# Patient Record
Sex: Male | Born: 1950 | Race: Black or African American | Hispanic: No | Marital: Married | Smoking: Never smoker
Health system: Southern US, Community
[De-identification: ages and names within clinical notes are randomized; demographics above are authoritative.]

## PROBLEM LIST (undated history)

## (undated) DIAGNOSIS — I10 Essential (primary) hypertension: Secondary | ICD-10-CM

## (undated) HISTORY — DX: Essential (primary) hypertension: I10

---

## 1999-11-23 ENCOUNTER — Encounter: Payer: Self-pay | Admitting: Emergency Medicine

## 1999-11-23 ENCOUNTER — Emergency Department (HOSPITAL_COMMUNITY): Admission: EM | Admit: 1999-11-23 | Discharge: 1999-11-23 | Payer: Self-pay | Admitting: Emergency Medicine

## 2001-12-28 ENCOUNTER — Emergency Department (HOSPITAL_COMMUNITY): Admission: EM | Admit: 2001-12-28 | Discharge: 2001-12-28 | Payer: Self-pay | Admitting: *Deleted

## 2001-12-28 ENCOUNTER — Encounter: Payer: Self-pay | Admitting: Emergency Medicine

## 2002-02-20 ENCOUNTER — Emergency Department (HOSPITAL_COMMUNITY): Admission: EM | Admit: 2002-02-20 | Discharge: 2002-02-20 | Payer: Self-pay | Admitting: Emergency Medicine

## 2012-01-24 ENCOUNTER — Ambulatory Visit (INDEPENDENT_AMBULATORY_CARE_PROVIDER_SITE_OTHER): Payer: BC Managed Care – PPO | Admitting: Internal Medicine

## 2012-01-24 VITALS — BP 144/78 | HR 76 | Temp 97.8°F | Resp 16 | Ht 67.5 in | Wt 149.0 lb

## 2012-01-24 DIAGNOSIS — Z719 Counseling, unspecified: Secondary | ICD-10-CM

## 2012-01-24 DIAGNOSIS — Z111 Encounter for screening for respiratory tuberculosis: Secondary | ICD-10-CM

## 2012-01-24 NOTE — Patient Instructions (Signed)

## 2012-01-24 NOTE — Progress Notes (Signed)
  Subjective:    Patient ID: Richard Lucas, male    DOB: November 29, 1950, 61 y.o.   MRN: [id number]  HPI Language barrier. 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 He appears well-developed and well-nourished.  HENT:  Right Ear: External ear normal.  Left Ear: External ear normal.  Eyes: EOM are normal.  Neck: Normal range of motion. Neck supple.  Cardiovascular: Normal rate, regular rhythm and normal heart sounds.   Pulmonary/Chest: Effort normal and breath sounds normal.  Neurological: He is alert and oriented to person, place, and time.  Skin: Skin is warm and dry.  Psychiatric: He has a normal mood and affect.          Assessment & Plan:  Apply PPD, read 48-72 hrs

## 2012-01-26 ENCOUNTER — Ambulatory Visit (INDEPENDENT_AMBULATORY_CARE_PROVIDER_SITE_OTHER): Payer: BC Managed Care – PPO | Admitting: Physician Assistant

## 2012-01-26 DIAGNOSIS — Z111 Encounter for screening for respiratory tuberculosis: Secondary | ICD-10-CM

## 2012-01-26 LAB — TB SKIN TEST
Induration: 0
TB Skin Test: NEGATIVE mm

## 2012-01-29 ENCOUNTER — Ambulatory Visit (INDEPENDENT_AMBULATORY_CARE_PROVIDER_SITE_OTHER): Payer: BC Managed Care – PPO | Admitting: Physician Assistant

## 2012-01-29 VITALS — BP 136/72 | HR 66 | Temp 97.5°F | Resp 16 | Ht 67.5 in | Wt 148.2 lb

## 2012-01-29 DIAGNOSIS — Z111 Encounter for screening for respiratory tuberculosis: Secondary | ICD-10-CM | POA: Insufficient documentation

## 2012-01-29 NOTE — Progress Notes (Signed)
   Patient ID: Jonathandavid Marlett MRN: [id number], DOB: June 20, 1951, 61 y.o. Date of Encounter: 01/29/2012, 3:04 PM  Primary Physician: No primary provider on file.  Chief Complaint: PPD placement  HPI: 61 y.o. year old male here for PPD. No prior positive PPD. Asymptomatic. Needs for school. This is his second PPD in a two-step PPD. No forms to fill out today.    Past Medical History  Diagnosis Date  . Hypertension      Home Meds: Prior to Admission medications   Medication Sig Start Date End Date Taking? Authorizing Provider  LISINOPRIL PO Take by mouth.   Yes Historical Provider, MD  lisinopril-hydrochlorothiazide (PRINZIDE,ZESTORETIC) 20-12.5 MG per tablet Take 1 tablet by mouth daily.   Yes Historical Provider, MD    Allergies: No Known Allergies  History   Social History  . Marital Status: Married    Spouse Name: N/A    Number of Children: N/A  . Years of Education: N/A   Occupational History  . Not on file.   Social History Main Topics  . Smoking status: Never Smoker   . Smokeless tobacco: Not on file  . Alcohol Use: Not on file  . Drug Use: Not on file  . Sexually Active: Not on file   Other Topics Concern  . Not on file   Social History Narrative  . No narrative on file     Review of Systems: Constitutional: negative for chills, fever, night sweats, weight changes, or fatigue  HEENT: negative for vision changes, hearing loss, or epistaxis Cardiovascular: negative for chest pain or palpitations Respiratory: negative for hemoptysis, wheezing, shortness of breath, or cough Abdominal: negative for abdominal pain, nausea, vomiting, diarrhea, or constipation Dermatological: negative for rash Neurologic: negative for headache, dizziness, or syncope All other systems reviewed and are otherwise negative with the exception to those above and in the HPI.   Physical Exam: Blood pressure 136/72, pulse 66, temperature 97.5 F (36.4 C), temperature source Oral, resp.  rate 16, height 5' 7.5" (1.715 m), weight 148 lb 3.2 oz (67.223 kg)., Body mass index is 22.87 kg/(m^2). General: Well developed, well nourished, in no acute distress. Head: Normocephalic, atraumatic, eyes without discharge, sclera non-icteric, nares are without discharge.   Neck: Supple. No thyromegaly. Full ROM. No lymphadenopathy. Lungs: Clear bilaterally to auscultation without wheezes, rales, or rhonchi. Breathing is unlabored. Heart: RRR with S1 S2. No murmurs, rubs, or gallops appreciated. Msk:  Strength and tone normal for age. Extremities/Skin: Warm and dry. No clubbing or cyanosis. No edema. No rashes or suspicious lesions. Neuro: Alert and oriented X 3. Moves all extremities spontaneously. Gait is normal. CNII-XII grossly in tact. Psych:  Responds to questions appropriately with a normal affect.     ASSESSMENT AND PLAN:  61 y.o. year old male here for PPD. -PPD placed -RTC 48-72 hours for reading  Signed, Eula Listen, PA-C 01/29/2012 3:04 PM

## 2012-02-01 ENCOUNTER — Ambulatory Visit (INDEPENDENT_AMBULATORY_CARE_PROVIDER_SITE_OTHER): Payer: BC Managed Care – PPO | Admitting: Physician Assistant

## 2012-02-01 DIAGNOSIS — Z111 Encounter for screening for respiratory tuberculosis: Secondary | ICD-10-CM

## 2012-02-01 LAB — TB SKIN TEST
Induration: 0 mm
TB Skin Test: NEGATIVE

## 2012-06-03 ENCOUNTER — Encounter: Payer: Self-pay | Admitting: Family Medicine

## 2012-06-03 DIAGNOSIS — Z789 Other specified health status: Secondary | ICD-10-CM | POA: Insufficient documentation

## 2012-10-22 ENCOUNTER — Emergency Department (HOSPITAL_COMMUNITY)
Admission: EM | Admit: 2012-10-22 | Discharge: 2012-10-22 | Disposition: A | Discharge status: Home / Self Care | Payer: Managed Care, Other (non HMO) | Attending: Emergency Medicine | Admitting: Emergency Medicine

## 2012-10-22 ENCOUNTER — Encounter (HOSPITAL_COMMUNITY): Payer: Self-pay | Admitting: *Deleted

## 2012-10-22 DIAGNOSIS — E860 Dehydration: Secondary | ICD-10-CM

## 2012-10-22 DIAGNOSIS — R5381 Other malaise: Secondary | ICD-10-CM | POA: Insufficient documentation

## 2012-10-22 DIAGNOSIS — I10 Essential (primary) hypertension: Secondary | ICD-10-CM | POA: Insufficient documentation

## 2012-10-22 DIAGNOSIS — R066 Hiccough: Secondary | ICD-10-CM | POA: Insufficient documentation

## 2012-10-22 DIAGNOSIS — R6883 Chills (without fever): Secondary | ICD-10-CM | POA: Insufficient documentation

## 2012-10-22 DIAGNOSIS — K529 Noninfective gastroenteritis and colitis, unspecified: Secondary | ICD-10-CM

## 2012-10-22 DIAGNOSIS — K5289 Other specified noninfective gastroenteritis and colitis: Secondary | ICD-10-CM | POA: Insufficient documentation

## 2012-10-22 DIAGNOSIS — E876 Hypokalemia: Secondary | ICD-10-CM

## 2012-10-22 DIAGNOSIS — R42 Dizziness and giddiness: Secondary | ICD-10-CM | POA: Insufficient documentation

## 2012-10-22 DIAGNOSIS — Z79899 Other long term (current) drug therapy: Secondary | ICD-10-CM | POA: Insufficient documentation

## 2012-10-22 LAB — COMPREHENSIVE METABOLIC PANEL
AST: 27 U/L (ref 0–37)
BUN: 18 mg/dL (ref 6–23)
CO2: 32 mEq/L (ref 19–32)
Calcium: 9.1 mg/dL (ref 8.4–10.5)
Chloride: 97 mEq/L (ref 96–112)
Creatinine, Ser: 1.25 mg/dL (ref 0.50–1.35)
GFR calc Af Amer: 69 mL/min — ABNORMAL LOW (ref >90)
GFR calc non Af Amer: 60 mL/min — ABNORMAL LOW (ref >90)
Total Bilirubin: 1.6 mg/dL — ABNORMAL HIGH (ref 0.3–1.2)

## 2012-10-22 LAB — GLUCOSE, CAPILLARY: Glucose-Capillary: 126 mg/dL — ABNORMAL HIGH (ref 70–99)

## 2012-10-22 LAB — CBC
HCT: 46.1 % (ref 39.0–52.0)
MCH: 29.5 pg (ref 26.0–34.0)
MCV: 86.7 fL (ref 78.0–100.0)
Platelets: 162 10*3/uL (ref 150–400)
RBC: 5.32 MIL/uL (ref 4.22–5.81)

## 2012-10-22 MED ORDER — METOCLOPRAMIDE HCL 5 MG/ML IJ SOLN
10.0000 mg | Freq: Once | INTRAMUSCULAR | Status: AC
  Administered 2012-10-22: 10 mg via INTRAVENOUS
  Filled 2012-10-22: qty 2

## 2012-10-22 MED ORDER — SODIUM CHLORIDE 0.9 % IV BOLUS (SEPSIS)
1000.0000 mL | Freq: Once | INTRAVENOUS | Status: AC
  Administered 2012-10-22: 1000 mL via INTRAVENOUS

## 2012-10-22 MED ORDER — POTASSIUM CHLORIDE CRYS ER 20 MEQ PO TBCR
40.0000 meq | EXTENDED_RELEASE_TABLET | Freq: Once | ORAL | Status: AC
  Administered 2012-10-22: 40 meq via ORAL
  Filled 2012-10-22: qty 2

## 2012-10-22 MED ORDER — ONDANSETRON HCL 4 MG PO TABS: 4.0000 mg | ORAL_TABLET | Freq: Three times a day (TID) | ORAL | Status: DC | PRN

## 2012-10-22 MED ORDER — ONDANSETRON HCL 4 MG/2ML IJ SOLN
4.0000 mg | Freq: Once | INTRAMUSCULAR | Status: AC
  Administered 2012-10-22: 4 mg via INTRAVENOUS
  Filled 2012-10-22: qty 2

## 2012-10-22 MED ORDER — GI COCKTAIL ~~LOC~~
30.0000 mL | Freq: Once | ORAL | Status: AC
  Administered 2012-10-22: 30 mL via ORAL
  Filled 2012-10-22: qty 30

## 2012-10-22 NOTE — ED Notes (Signed)
Pt arrives from home by [facility] with c/o nausea x's 2 days and vomiting starting today.

## 2012-10-22 NOTE — ED Notes (Signed)
Pt c/o indigestion and requesting something for it. Dr. Truitt Merle and orders placed. Medication to be given after nausea is resolved.

## 2012-10-22 NOTE — ED Notes (Signed)
Pt tolerated 1 cu[p of ginger ale without nausea or vomiting. Pt ambulated greater than 34ft with difficulty.

## 2012-10-22 NOTE — ED Notes (Signed)
[id number]> Expected date:<BR> Expected time:<BR> Means of arrival:<BR> Comments:<BR> Ems/ n/v,

## 2012-10-22 NOTE — ED Provider Notes (Signed)
I saw and evaluated the patient, reviewed the resident's note and I agree with the findings and plan.   .Face to face Exam:  General:  Awake HEENT:  Atraumatic Resp:  Normal effort Abd:  Nondistended Neuro:No focal weakness Lymph: No adenopathy  Nelia Shi, MD 10/22/12 1446

## 2012-10-22 NOTE — ED Provider Notes (Signed)
History    CSN: [id number]  Arrival date & time 10/22/12  1124   First MD Initiated Contact with Patient 10/22/12 1133      Chief Complaint  Patient presents with  . Nausea  . Emesis    HPI Pt is a 62 yo M with pmh of HTN presenting with 2 day history of progressively worsening N/V/D. He states it started with diarrhea and hiccups. He then developed vomiting last night and has emesis x3 total. He has not been able to tolerate and food/drink. He states he has not had fevers, but has had some chills and abd pain. He started feeling much worse this morning with light headedness and fatigue. EMS was called. When they arrived at this house, he was diaphoretic and lightheaded. He did not had LOC. Pt works as a Lawyer with sick contacts. No other recent illnesses or new medications.   Past Medical History  Diagnosis Date  . Hypertension     History reviewed. No pertinent past surgical history.  History reviewed. No pertinent family history.  History  Substance Use Topics  . Smoking status: Never Smoker   . Smokeless tobacco: Not on file  . Alcohol Use: Not on file    Review of Systems  Constitutional: Positive for chills and fatigue. Negative for fever.  HENT: Negative for congestion.   Respiratory: Negative for shortness of breath.   Cardiovascular: Negative for chest pain.  Gastrointestinal: Positive for nausea, vomiting, abdominal pain and diarrhea.  Genitourinary: Negative for difficulty urinating.  Musculoskeletal: Negative for myalgias.  Skin: Negative for rash.  All other systems reviewed and are negative.    Allergies  Review of patient's allergies indicates no known allergies.  Home Medications   Current Outpatient Rx  Name  Route  Sig  Dispense  Refill  . lisinopril-hydrochlorothiazide (PRINZIDE,ZESTORETIC) 20-25 MG per tablet   Oral   Take 1 tablet by mouth daily.         . ondansetron (ZOFRAN) 4 MG tablet   Oral   Take 1 tablet (4 mg total) by mouth every  8 (eight) hours as needed for nausea.   20 tablet   0     BP 139/64  Pulse 77  Temp(Src) 97.6 F (36.4 C) (Oral)  Resp 16  SpO2 100%  Physical Exam  Constitutional: He is oriented to person, place, and time. He appears well-developed and well-nourished. He has a sickly appearance.  HENT:  Head: Normocephalic and atraumatic.  Mouth/Throat: Oropharynx is clear and moist. No oropharyngeal exudate.  Eyes: Pupils are equal, round, and reactive to light.  Neck: Normal range of motion. Neck supple.  Cardiovascular: Normal rate and regular rhythm.   Pulmonary/Chest: Effort normal and breath sounds normal. He has no wheezes.  Abdominal: Soft. Bowel sounds are normal. There is no tenderness. There is no rebound and no guarding.  Musculoskeletal: Normal range of motion. He exhibits no edema and no tenderness.  Neurological: He is alert and oriented to person, place, and time.    ED Course  Procedures (including critical care time)  Labs Reviewed  GLUCOSE, CAPILLARY - Abnormal; Notable for the following:    Glucose-Capillary 126 (*)    All other components within normal limits  COMPREHENSIVE METABOLIC PANEL - Abnormal; Notable for the following:    Potassium 3.2 (*)    Glucose, Bld 141 (*)    Total Bilirubin 1.6 (*)    GFR calc non Af Amer 60 (*)    GFR calc Af Denyse Dago  69 (*)    All other components within normal limits  CBC   No results found.   1. Gastroenteritis   2. Dehydration   3. Hypokalemia      MDM  62 yo M with 2 days of worsening N/V/D with pre-syncopal symptoms  Will check EKG, CBG, CBC, CMet. Give Zofran and IVF. Requesting something for indigestion; ordered GI cocktail.  1232- Labs reviewed, K+ slightly low likely secondary to vomiting and diarrhea. Otherwise, labs wnl. 1310- Spoke with wife after her arrival to the ED. She states pt had complete syncopal episode prior to EMS arrival, but then came to and was able to respond with no difficulty. No further  episodes but does still have hiccups.  1325- Will order another bolus, Kdur and fluid challenge for patient. 1424- Tolerating po fluids and feeling better. Will d/c with Zofran. Follow up if worsens.  Hilarie Fredrickson, MD 10/22/12 [phone]

## 2012-10-24 ENCOUNTER — Ambulatory Visit (INDEPENDENT_AMBULATORY_CARE_PROVIDER_SITE_OTHER): Payer: Managed Care, Other (non HMO) | Admitting: Emergency Medicine

## 2012-10-24 VITALS — BP 140/78 | HR 86 | Temp 98.5°F | Resp 16 | Ht 69.0 in | Wt 150.0 lb

## 2012-10-24 DIAGNOSIS — R222 Localized swelling, mass and lump, trunk: Secondary | ICD-10-CM

## 2012-10-24 DIAGNOSIS — R066 Hiccough: Secondary | ICD-10-CM

## 2012-10-24 DIAGNOSIS — K219 Gastro-esophageal reflux disease without esophagitis: Secondary | ICD-10-CM

## 2012-10-24 MED ORDER — LANSOPRAZOLE 30 MG PO CPDR: 30.0000 mg | DELAYED_RELEASE_CAPSULE | Freq: Every day | ORAL | Status: DC

## 2012-10-24 NOTE — Patient Instructions (Signed)
Place gastroesophageal reflux disease patient instructions here. Hiccups Hiccups are caused by a sudden contraction of the muscles between the ribs and the muscle under your lungs (diaphragm). When you hiccup, the top of your windpipe (glottis) closes immediately after your diaphragm contracts. This makes the typical 'hic' sound. A hiccup is a reflex that you cannot stop. Unlike other reflexes such as coughing and sneezing, hiccups do not seem to have any useful purpose. There are 3 types of hiccups:   Benign bouts: last less than 48 hours.  Persistent: last more than 48 hours but less than 1 month.  Intractable: last more than 1 month. CAUSES  Most people have bouts of hiccups from time to time. They start for no apparent reason, last a short while, and then stop. Sometimes they are due to:  A temporary swollen stomach caused by overeating or eating too fast, eating spicy foods, drinking fizzy drinks, or swallowing air.  A sudden change in temperature (very hot or cold foods or drinks, a cold shower).  Drinking alcohol or using tobacco. There are no particular tests used to diagnose hiccups. Hiccups are usually considered harmless and do not point to a serious medical condition. However, there can be underlying medical problems that may cause hiccups, such as pneumonia, diabetes, metabolic problems, tumors, abdominal infections or injuries, and neurologic problems.You must follow up with your caregiver if your symptoms persist or become a frequent problem. TREATMENT   Most cases need no treatment. A bout of benign hiccups usually does not last long.  Medicine is sometimes needed to stop persistent hiccups. Medicine may be given intravenously (IV) or by mouth.  Hypnosis or acupuncture may be suggested.  Surgery to affect the nerve that supplies the diaphragm may be tried in severe cases.  Treatment of an underlying cause is needed in some cases. HOME CARE INSTRUCTIONS  Popular remedies  that may stop a bout of hiccups include:  Gargling ice water.  Swallowing granulated sugar.  Biting on a lemon.  Holding your breath, breathing fast, or breathing into a paper bag.  Bearing down.  Gasping after a sudden fright.  Pulling your tongue gently.  Distraction. SEEK MEDICAL CARE IF:   Hiccups last for more than 48 hours.  You are given medicine but your hiccups do not get better.  New symptoms show up.  You cannot sleep or eat due to the hiccups.  You have unexpected weight loss.  You have trouble breathing or swallowing.  You develop severe pain in your abdomen or other areas.  You develop numbness, tingling, or weakness. Document Released: 11/05/2001 Document Revised: 11/19/2011 Document Reviewed: 10/18/2010 [facility] Patient Information 2013 [place], [place].

## 2012-10-24 NOTE — Progress Notes (Signed)
[facility] [address] [place] [address] [phone]  Date:  10/24/2012   Name:  Richard Lucas   DOB:  December 29, 1950   MRN:  [id number]  PCP:  No primary provider on file.    Chief Complaint: Abdominal Pain   History of Present Illness:  Richard Lucas is a 62 y.o. very pleasant male patient who presents with the following:  Was in ER at [facility] for gastroenteritis and discharged on zofran.  Has done well and remained hydrated with medication.  Now has three day history of hiccups.  No improvement with OTC treatments.  Denies nausea or vomiting. No fever or chills or neurological symptoms.  Has pain around base of chest from continued hiccups.  No cough or wheezing or shortness of breath.  No fever or chills.  Has frequent heartburn and burning pain in chest denies waterbrash.  Nonsmoker.  No excess caffeine or alcohol.  Patient Active Problem List  Diagnosis  . PPD screening test  . Immune to varicella  . Immune to rubella  . Immune to mumps    Past Medical History  Diagnosis Date  . Hypertension     History reviewed. No pertinent past surgical history.  History  Substance Use Topics  . Smoking status: Never Smoker   . Smokeless tobacco: Not on file  . Alcohol Use: Not on file    No family history on file.  No Known Allergies  Medication list has been reviewed and updated.  Current Outpatient Prescriptions on File Prior to Visit  Medication Sig Dispense Refill  . lisinopril-hydrochlorothiazide (PRINZIDE,ZESTORETIC) 20-25 MG per tablet Take 1 tablet by mouth daily.      . ondansetron (ZOFRAN) 4 MG tablet Take 1 tablet (4 mg total) by mouth every 8 (eight) hours as needed for nausea.  20 tablet  0   No current facility-administered medications on file prior to visit.    Review of Systems:  As per HPI, otherwise negative.    Physical Examination: Filed Vitals:   10/24/12 1640  BP: 140/78  Pulse: 86  Temp: 98.5 F (36.9 C)  Resp:  16   Filed Vitals:   10/24/12 1640  Height: 5\' 9"  (1.753 m)  Weight: 150 lb (68.04 kg)   Body mass index is 22.14 kg/(m^2). Ideal Body Weight: Weight in (lb) to have BMI = 25: 168.9  GEN: WDWN, NAD, Non-toxic, A & O x 3 HEENT: Atraumatic, Normocephalic. Neck supple. No masses, No LAD. Ears and Nose: No external deformity. CV: RRR, No M/G/R. No JVD. No thrill. No extra heart sounds. PULM: CTA B, no wheezes, crackles, rhonchi. No retractions. No resp. distress. No accessory muscle use. ABD: S, NT, ND, +BS. No rebound. No HSM. EXTR: No c/c/e NEURO Normal gait.  PSYCH: Normally interactive. Conversant. Not depressed or anxious appearing.  Calm demeanor.    Assessment and Plan: Hiccups Resolved with breath holding Gastritis vs GERD prevacid  Carmelina Dane, MD

## 2012-10-27 ENCOUNTER — Ambulatory Visit (INDEPENDENT_AMBULATORY_CARE_PROVIDER_SITE_OTHER): Payer: Managed Care, Other (non HMO) | Admitting: Emergency Medicine

## 2012-10-27 VITALS — BP 152/74 | HR 64 | Temp 98.3°F | Resp 16 | Ht 67.75 in | Wt 146.0 lb

## 2012-10-27 DIAGNOSIS — R066 Hiccough: Secondary | ICD-10-CM

## 2012-10-27 MED ORDER — LISINOPRIL-HYDROCHLOROTHIAZIDE 20-25 MG PO TABS: 1.0000 | ORAL_TABLET | Freq: Every day | ORAL | Status: DC

## 2012-10-27 MED ORDER — CHLORPROMAZINE HCL 25 MG PO TABS: 25.0000 mg | ORAL_TABLET | Freq: Three times a day (TID) | ORAL | Status: DC

## 2012-10-27 NOTE — Patient Instructions (Addendum)
Tardive Dyskinesia  Tardive dyskinesia is a neurological syndrome. It is caused by the long-term use of neuroleptic drugs. Those drugs are generally prescribed for psychiatric disorders, as well as for some gastrointestinal and neurological disorders. This syndrome causes repetitive, involuntary, purposeless movements.  SYMPTOMS    Grimacing.   Tongue protrusion.   Lip smacking, puckering, and pursing.   Rapid eye blinking.   Rapid movements of the arms, legs, and trunk.   Impaired movements of the fingers. It may appear as though the patient is playing an invisible guitar or piano.  TREATMENT   There is no standard treatment for this syndrome. Treatment is highly individualized. The first step is generally to stop or minimize the use of the neuroleptic drug. But for patients with a severe underlying condition this may not be a feasible option. Replacing the neuroleptic drug with substitute drugs may help some patients. Other drugs may also be helpful. Examples include:   Benzodiazepines.   Adrenergic antagonists.   Dopamine agonists.  Symptoms may last long after the neuroleptic drugs have been stopped. But, with careful management, some symptoms may improve and/or disappear with time.  Document Released: 08/17/2002 Document Revised: 11/19/2011 Document Reviewed: 08/27/2005  [employer] Patient Information 2013 [employer].

## 2012-10-27 NOTE — Addendum Note (Signed)
Addended by: Carmelina Dane on: 10/27/2012 06:32 PM   Modules accepted: Orders

## 2012-10-27 NOTE — Progress Notes (Signed)
[facility] [address], [place] [address] [phone]  Date:  10/27/2012   Name:  Richard Lucas   DOB:  21-May-1951   MRN:  [id number]  PCP:  No primary provider on file.    Chief Complaint: Follow-up   History of Present Illness:  Richard Lucas is a 62 y.o. very pleasant male patient who presents with the following:  Continues to have unrelenting hiccups.  Not receiving any benefit from breath holding or water drinking as he had initially.  No nausea or vomiting. No dyspepsia.  No fever or chills.  No other complaints.  Patient Active Problem List  Diagnosis  . PPD screening test  . Immune to varicella  . Immune to rubella  . Immune to mumps    Past Medical History  Diagnosis Date  . Hypertension     No past surgical history on file.  History  Substance Use Topics  . Smoking status: Never Smoker   . Smokeless tobacco: Not on file  . Alcohol Use: Not on file    No family history on file.  No Known Allergies  Medication list has been reviewed and updated.  Current Outpatient Prescriptions on File Prior to Visit  Medication Sig Dispense Refill  . lansoprazole (PREVACID) 30 MG capsule Take 1 capsule (30 mg total) by mouth daily.  30 capsule  2  . lisinopril-hydrochlorothiazide (PRINZIDE,ZESTORETIC) 20-25 MG per tablet Take 1 tablet by mouth daily.      . ondansetron (ZOFRAN) 4 MG tablet Take 1 tablet (4 mg total) by mouth every 8 (eight) hours as needed for nausea.  20 tablet  0   No current facility-administered medications on file prior to visit.    Review of Systems:  As per HPI, otherwise negative.    Physical Examination: Filed Vitals:   10/27/12 1748  BP: 152/74  Pulse: 64  Temp: 98.3 F (36.8 C)  Resp: 16   Filed Vitals:   10/27/12 1748  Height: 5' 7.75" (1.721 m)  Weight: 146 lb (66.225 kg)   Body mass index is 22.36 kg/(m^2). Ideal Body Weight: Weight in (lb) to have BMI = 25: 162.9  GEN: WDWN, NAD, Non-toxic, A &  O x 3 HEENT: Atraumatic, Normocephalic. Neck supple. No masses, No LAD. Ears and Nose: No external deformity. CV: RRR, No M/G/R. No JVD. No thrill. No extra heart sounds. PULM: CTA B, no wheezes, crackles, rhonchi. No retractions. No resp. distress. No accessory muscle use. ABD: S, NT, ND, +BS. No rebound. No HSM. EXTR: No c/c/e NEURO Normal gait.  PSYCH: Normally interactive. Conversant. Not depressed or anxious appearing.  Calm demeanor.    Assessment and Plan: Hiccups. Try course of po thorazine GI consultation  Carmelina Dane, MD

## 2013-05-14 ENCOUNTER — Ambulatory Visit (INDEPENDENT_AMBULATORY_CARE_PROVIDER_SITE_OTHER): Payer: Managed Care, Other (non HMO) | Admitting: Emergency Medicine

## 2013-05-14 ENCOUNTER — Ambulatory Visit: Payer: Medicare PPO

## 2013-05-14 VITALS — BP 118/72 | HR 67 | Temp 98.0°F | Resp 17 | Ht 68.0 in | Wt 146.0 lb

## 2013-05-14 DIAGNOSIS — M549 Dorsalgia, unspecified: Secondary | ICD-10-CM

## 2013-05-14 DIAGNOSIS — Z23 Encounter for immunization: Secondary | ICD-10-CM

## 2013-05-14 DIAGNOSIS — IMO0002 Reserved for concepts with insufficient information to code with codable children: Secondary | ICD-10-CM

## 2013-05-14 DIAGNOSIS — M79609 Pain in unspecified limb: Secondary | ICD-10-CM

## 2013-05-14 DIAGNOSIS — Z0000 Encounter for general adult medical examination without abnormal findings: Secondary | ICD-10-CM

## 2013-05-14 DIAGNOSIS — I10 Essential (primary) hypertension: Secondary | ICD-10-CM

## 2013-05-14 DIAGNOSIS — Z1211 Encounter for screening for malignant neoplasm of colon: Secondary | ICD-10-CM

## 2013-05-14 LAB — POCT URINALYSIS DIPSTICK
Blood, UA: NEGATIVE
Glucose, UA: NEGATIVE
Nitrite, UA: NEGATIVE
Spec Grav, UA: 1.02
Urobilinogen, UA: 0.2
pH, UA: 5.5

## 2013-05-14 LAB — POCT CBC
HCT, POC: 40.2 % — AB (ref 43.5–53.7)
Hemoglobin: 12.8 g/dL — AB (ref 14.1–18.1)
Lymph, poc: 1.5 (ref 0.6–3.4)
MCH, POC: 28.8 pg (ref 27–31.2)
MCHC: 31.8 g/dL (ref 31.8–35.4)
MCV: 90.6 fL (ref 80–97)
POC Granulocyte: 1.8 — AB (ref 2–6.9)
POC LYMPH PERCENT: 42.5 %L (ref 10–50)
RDW, POC: 14.3 %
WBC: 3.6 10*3/uL — AB (ref 4.6–10.2)

## 2013-05-14 LAB — POCT UA - MICROSCOPIC ONLY
Bacteria, U Microscopic: NEGATIVE
Casts, Ur, LPF, POC: NEGATIVE
Crystals, Ur, HPF, POC: NEGATIVE
Epithelial cells, urine per micros: NEGATIVE
Yeast, UA: NEGATIVE

## 2013-05-14 MED ORDER — LISINOPRIL-HYDROCHLOROTHIAZIDE 20-25 MG PO TABS: 1.0000 | ORAL_TABLET | Freq: Every day | ORAL | Status: DC

## 2013-05-14 NOTE — Progress Notes (Signed)
@[email or web address] @  Patient ID: Richard Lucas MRN: [id number], DOB: 03-02-1951 62 y.o. Date of Encounter: 05/14/2013, 12:27 PM  Primary Physician: No primary provider on file.  Chief Complaint: Physical (CPE)  HPI: 62 y.o. y/o male with history noted below here for CPE.  Doing well. No issues/complaints.  Review of Systems:  Consitutional: No fever, chills, fatigue, night sweats, lymphadenopathy, or weight changes. Eyes: No visual changes, eye redness, or discharge. ENT/Mouth: Ears: No otalgia, tinnitus, hearing loss, discharge. Nose: No congestion, rhinorrhea, sinus pain, or epistaxis. Throat: No sore throat, post nasal drip, or teeth pain. Cardiovascular: No CP, palpitations, diaphoresis, DOE, edema, orthopnea, PND. Respiratory: No cough, hemoptysis, SOB, or wheezing. Gastrointestinal: No anorexia, dysphagia, reflux, pain, nausea, vomiting, hematemesis, diarrhea, constipation, BRBPR, or melena. Genitourinary: No dysuria, frequency, urgency, hematuria, incontinence, nocturia, decreased urinary stream, discharge, impotence, or testicular pain/masses. Musculoskeletal: No decreased ROM, myalgias, stiffness, joint swelling, or weakness. Skin: No rash, erythema, lesion changes, pain, warmth, jaundice, or pruritis. Neurological: No headache, dizziness, syncope, seizures, tremors, memory loss, coordination problems, or paresthesias. Psychological: No anxiety, depression, hallucinations, SI/HI. Endocrine: No fatigue, polydipsia, polyphagia, polyuria, or known diabetes. All other systems were reviewed and are otherwise negative.  Past Medical History  Diagnosis Date  . Hypertension      No past surgical history on file.  Home Meds:  Prior to Admission medications   Medication Sig Start Date End Date Taking? Authorizing Provider  chlorproMAZINE (THORAZINE) 25 MG tablet Take 1 tablet (25 mg total) by mouth 3 (three) times daily. 10/27/12  Yes Phillips Odor, MD  lansoprazole (PREVACID) 30 MG capsule  Take 1 capsule (30 mg total) by mouth daily. 10/24/12  Yes Phillips Odor, MD  lisinopril-hydrochlorothiazide (PRINZIDE,ZESTORETIC) 20-25 MG per tablet Take 1 tablet by mouth daily. 10/27/12  Yes Phillips Odor, MD  ondansetron (ZOFRAN) 4 MG tablet Take 1 tablet (4 mg total) by mouth every 8 (eight) hours as needed for nausea. 10/22/12  Yes Amber Nydia Bouton, MD    Allergies: No Known Allergies  History   Social History  . Marital Status: Married    Spouse Name: N/A    Number of Children: N/A  . Years of Education: N/A   Occupational History  . Not on file.   Social History Main Topics  . Smoking status: Never Smoker   . Smokeless tobacco: Not on file  . Alcohol Use: Not on file  . Drug Use: Not on file  . Sexual Activity: Not on file   Other Topics Concern  . Not on file   Social History Narrative  . No narrative on file    No family history on file.  Physical Exam:  Blood pressure 118/72, pulse 67, temperature 98 F (36.7 C), temperature source Oral, resp. rate 17, height 5\' 8"  (1.727 m), weight 146 lb (66.225 kg), SpO2 97.00%.  General: Well developed, well nourished, in no acute distress. HEENT: Normocephalic, atraumatic. Conjunctiva pink, sclera non-icteric. Pupils 2 mm constricting to 1 mm, round, regular, and equally reactive to light and accomodation. EOMI. Internal auditory canal clear. TMs with good cone of light and without pathology. Nasal mucosa pink. Nares are without discharge. No sinus tenderness. Oral mucosa pink. Dentition . Pharynx without exudate.   Neck: Supple. Trachea midline. No thyromegaly. Full ROM. No lymphadenopathy. Lungs: Clear to auscultation bilaterally without wheezes, rales, or rhonchi. Breathing is of normal effort and unlabored. Cardiovascular: RRR with S1 S2. No murmurs, rubs, or gallops appreciated. Distal pulses 2+ symmetrically. No carotid or abdominal bruits.  Abdomen: Soft, non-tender, non-distended with normoactive bowel sounds.  No hepatosplenomegaly or masses. No rebound/guarding. No CVA tenderness. Without hernias.  Rectal: No external hemorrhoids or fissures. Rectal vault without masses.   Genitourinary:   circumcised male. No penile lesions. Testes descended bilaterally, and smooth without tenderness or masses.  Musculoskeletal: Full range of motion and 5/5 strength throughout. Without swelling, atrophy, tenderness, crepitus, or warmth. Extremities without clubbing, cyanosis, or edema. Calves supple. Skin: Warm and moist without erythema, ecchymosis, wounds, or rash. Neuro: A+Ox3. CN II-XII grossly intact. Moves all extremities spontaneously. Full sensation throughout. Normal gait. DTR 2+ throughout upper and lower extremities. Finger to nose intact. Psych:  Responds to questions appropriately with a normal affect.   Results for orders placed in visit on 05/14/13  POCT CBC      Result Value Range   WBC 3.6 (*) 4.6 - 10.2 K/uL   Lymph, poc 1.5  0.6 - 3.4   POC LYMPH PERCENT 42.5  10 - 50 %L   MID (cbc) 0.3  0 - 0.9   POC MID % 7.2  0 - 12 %M   POC Granulocyte 1.8 (*) 2 - 6.9   Granulocyte percent 50.3  37 - 80 %G   RBC 4.44 (*) 4.69 - 6.13 M/uL   Hemoglobin 12.8 (*) 14.1 - 18.1 g/dL   HCT, POC 40.9 (*) 81.1 - 53.7 %   MCV 90.6  80 - 97 fL   MCH, POC 28.8  27 - 31.2 pg   MCHC 31.8  31.8 - 35.4 g/dL   RDW, POC 91.4     Platelet Count, POC 138 (*) 142 - 424 K/uL   MPV 10.8  0 - 99.8 fL  IFOBT (OCCULT BLOOD)      Result Value Range   IFOBT Negative    POCT UA - MICROSCOPIC ONLY      Result Value Range   WBC, Ur, HPF, POC neg     RBC, urine, microscopic neg     Bacteria, U Microscopic neg     Mucus, UA trace     Epithelial cells, urine per micros neg     Crystals, Ur, HPF, POC neg     Casts, Ur, LPF, POC neg     Yeast, UA neg    POCT URINALYSIS DIPSTICK      Result Value Range   Color, UA yellow     Clarity, UA clear     Glucose, UA neg     Bilirubin, UA neg     Ketones, UA neg     Spec Grav,  UA 1.020     Blood, UA neg     pH, UA 5.5     Protein, UA neg     Urobilinogen, UA 0.2     Nitrite, UA neg     Leukocytes, UA Negative     Studies: CBC, CMET, Lipid, PSA, cpk  [facility] reading (PRIMARY) by  Dr.Melvyn Hommes no evidence of fracture or malalignment Assessment/Plan:  62 y.o. y/o male here for physical exam. He is concerned about his inability to have children and would like to see urologist and have his semen checked. Referral has been made for colonoscopy. He works as a Lawyer. He was given a prescription for shingles vaccine. He recently had gone back to school and feels like his tetanus is up-to-date.  -  Signed, Earl Lites, MD 05/14/2013 12:27 PM

## 2013-05-15 ENCOUNTER — Telehealth: Payer: Self-pay | Admitting: Radiology

## 2013-05-15 ENCOUNTER — Other Ambulatory Visit: Payer: Self-pay | Admitting: Emergency Medicine

## 2013-05-15 ENCOUNTER — Other Ambulatory Visit: Payer: Self-pay | Admitting: Radiology

## 2013-05-15 DIAGNOSIS — R748 Abnormal levels of other serum enzymes: Secondary | ICD-10-CM

## 2013-05-15 LAB — HEPATITIS B SURFACE ANTIGEN: Hepatitis B Surface Ag: NEGATIVE

## 2013-05-15 LAB — CK: Total CK: 297 U/L — ABNORMAL HIGH (ref 7–232)

## 2013-05-15 LAB — PSA: PSA: 0.87 ng/mL (ref <=4.00)

## 2013-05-15 LAB — LIPID PANEL
Cholesterol: 180 mg/dL (ref 0–200)
HDL: 41 mg/dL (ref >39)
LDL Cholesterol: 116 mg/dL — ABNORMAL HIGH (ref 0–99)
Total CHOL/HDL Ratio: 4.4 Ratio
Triglycerides: 114 mg/dL (ref <150)
VLDL: 23 mg/dL (ref 0–40)

## 2013-05-15 LAB — COMPREHENSIVE METABOLIC PANEL
Albumin: 4.7 g/dL (ref 3.5–5.2)
Alkaline Phosphatase: 43 U/L (ref 39–117)
BUN: 15 mg/dL (ref 6–23)
CO2: 27 mEq/L (ref 19–32)
Glucose, Bld: 103 mg/dL — ABNORMAL HIGH (ref 70–99)
Potassium: 3.7 mEq/L (ref 3.5–5.3)
Sodium: 138 mEq/L (ref 135–145)
Total Bilirubin: 1.3 mg/dL — ABNORMAL HIGH (ref 0.3–1.2)
Total Protein: 6.9 g/dL (ref 6.0–8.3)

## 2013-05-15 LAB — TSH: TSH: 1.517 u[IU]/mL (ref 0.350–4.500)

## 2013-05-15 NOTE — Telephone Encounter (Signed)
I have spoken to patient regarding his lab results, order placed for the CK and sed rate, but I could not find the Adolase you wanted to order, please help with this.

## 2013-05-16 ENCOUNTER — Other Ambulatory Visit: Payer: Self-pay

## 2013-05-16 DIAGNOSIS — R748 Abnormal levels of other serum enzymes: Secondary | ICD-10-CM

## 2013-07-28 ENCOUNTER — Encounter: Payer: Self-pay | Admitting: Emergency Medicine

## 2014-01-22 ENCOUNTER — Ambulatory Visit (INDEPENDENT_AMBULATORY_CARE_PROVIDER_SITE_OTHER): Payer: Medicare PPO | Admitting: Family Medicine

## 2014-01-22 VITALS — BP 120/70 | HR 61 | Temp 97.8°F | Resp 16 | Ht 66.5 in | Wt 148.0 lb

## 2014-01-22 DIAGNOSIS — Z0000 Encounter for general adult medical examination without abnormal findings: Secondary | ICD-10-CM

## 2014-01-22 NOTE — Progress Notes (Signed)
° °  Subjective:    Patient ID: Richard Lucas, male    DOB: 06/01/1951, 63 y.o.   MRN: [id number]  HPI This chart was scribed for Elvina SidleKurt Lauenstein, MD by Andrew Auaven Small, ED Scribe. This patient was seen in room 5 and the patient's care was started at 11:13 AM.  HPI Comments: Richard Lucas is a 63 y.o. male who presents to the [facility] here for a physical. 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  Pt is from [place]. Pt reports using treadmill for exercise.   Past Medical History  Diagnosis Date   Hypertension    No Known Allergies Prior to Admission medications   Medication Sig Start Date End Date Taking? Authorizing Provider  lisinopril-hydrochlorothiazide (PRINZIDE,ZESTORETIC) 20-25 MG per tablet Take 1 tablet by mouth daily. 05/14/13  Yes Collene GobbleSteven A Daub, MD  chlorproMAZINE (THORAZINE) 25 MG tablet Take 1 tablet (25 mg total) by mouth 3 (three) times daily. 10/27/12   Phillips OdorJeffery Anderson, MD  lansoprazole (PREVACID) 30 MG capsule Take 1 capsule (30 mg total) by mouth daily. 10/24/12   Phillips OdorJeffery Anderson, MD  ondansetron (ZOFRAN) 4 MG tablet Take 1 tablet (4 mg total) by mouth every 8 (eight) hours as needed for nausea. 10/22/12   Amber Nydia BoutonM Hairford, MD   Review of Systems  All other systems reviewed and are negative.     Objective:   Physical Exam  Nursing note and vitals reviewed. Constitutional: He is oriented to person, place, and time. He appears well-developed and well-nourished. No distress.  HENT:  Head: Normocephalic and atraumatic.  Right Ear: External ear normal.  Left Ear: External ear normal.  Nose: Nose normal.  Mouth/Throat: Oropharynx is clear and moist.  Eyes: EOM are normal. Pupils are equal, round, and reactive to light.  Fundoscopic exam:      The right eye shows no arteriolar narrowing, no AV nicking, no exudate, no hemorrhage and no papilledema. The right eye shows no red reflex and no venous pulsations.         The left eye shows no arteriolar narrowing, no AV nicking, no exudate, no hemorrhage and no papilledema. The left eye shows no red reflex and no venous pulsations.  Normal funduscopic exam   Neck: Neck supple.  Cardiovascular: Normal rate, regular rhythm, normal heart sounds and intact distal pulses.  Exam reveals no gallop and no friction rub.   No murmur heard. Pulmonary/Chest: Effort normal and breath sounds normal. No respiratory distress. He has no wheezes. He has no rales. He exhibits no tenderness.  Abdominal: Soft. He exhibits no distension and no mass. There is no tenderness. There is no rebound and no guarding. Hernia confirmed negative in the right inguinal area and confirmed negative in the left inguinal area.  Genitourinary: Guaiac negative stool.  Musculoskeletal: Normal range of motion.  Neurological: He is alert and oriented to person, place, and time.  Skin: Skin is warm and dry.  Psychiatric: He has a normal mood and affect. His behavior is normal.  .this    Assessment & Plan:   1. Annual physical exam    Annual physical exam - Plan: Quantiferon tb gold assay  Elvina SidleKurt Lauenstein, Md

## 2014-01-22 NOTE — Progress Notes (Signed)
  Tuberculosis Risk Questionnaire  1. Yes  Were you born outside the [place] in one of the following parts of the world: [place], Asia, Central America, [place] America or Eastern [place]?    2. Yes  Have you traveled outside the [place] and lived for more than one month in one of the following parts of the world: Africa, Asia, [place], [place] or Eastern [place]?    3. No Do you have a compromised immune system such as from any of the following conditions:HIV/AIDS, organ or bone marrow transplantation, diabetes, immunosuppressive medicines (e.g. Prednisone, Remicaide), leukemia, lymphoma, cancer of the head or neck, gastrectomy or jejunal bypass, end-stage renal disease (on dialysis), or silicosis?     4. Yes  Have you ever or do you plan on working in: a residential care center, a health care facility, a jail or prison or homeless shelter?    5. No Have you ever: injected illegal drugs, used crack cocaine, lived in a homeless shelter  or been in jail or prison?     6. No Have you ever been exposed to anyone with infectious tuberculosis?    Tuberculosis Symptom Questionnaire  Do you currently have any of the following symptoms?  1. No Unexplained cough lasting more than 3 weeks?   2. No Unexplained fever lasting more than 3 weeks.   3. No Night Sweats (sweating that leaves the bedclothes and sheets wet)     4. No Shortness of Breath   5. No Chest Pain   6. No Unintentional weight loss    7. No Unexplained fatigue (very tired for no reason)   

## 2014-01-22 NOTE — Patient Instructions (Signed)

## 2014-01-26 LAB — QUANTIFERON TB GOLD ASSAY (BLOOD)
Interferon Gamma Release Assay: NEGATIVE
Mitogen value: >10 IU/mL
Quantiferon Nil Value: 0.02 IU/mL
Quantiferon Tb Ag Minus Nil Value: 0 IU/mL
TB Ag value: 0.02 IU/mL

## 2014-05-20 ENCOUNTER — Telehealth: Payer: Self-pay | Admitting: Radiology

## 2014-05-20 NOTE — Telephone Encounter (Signed)
Yes please just place him orders only for a CK and it should be done after he has not worked for a couple of days

## 2014-05-20 NOTE — Telephone Encounter (Signed)
Message copied by Caffie Damme on Thu May 20, 2014 10:00 AM ------      Message from: Lesle Chris A      Created: Thu May 20, 2014  7:24 AM       I believe this test was ordered a year ago. If she is is not having any muscle pain or discomfort no need to extend. She saw Dr. Arnoldo Lenis recently and there was no mention of muscle pain l      ----- Message -----         From: SYSTEM         Sent: 05/20/2014  12:01 AM           To: Collene Gobble, MD                   ------

## 2014-05-20 NOTE — Telephone Encounter (Signed)
Patient had elevated CK a year ago. You wanted to repeat in 1 week, patient advised, but did not come in. Do you want him to have this done now? He does not complain of any muscle pain, he was here for a wellness exam for his employer

## 2014-05-26 NOTE — Telephone Encounter (Signed)
Called pt left message for him to call me back.

## 2014-05-31 ENCOUNTER — Other Ambulatory Visit: Payer: Self-pay | Admitting: Emergency Medicine

## 2014-06-01 ENCOUNTER — Encounter: Payer: Self-pay | Admitting: Radiology

## 2014-06-01 NOTE — Telephone Encounter (Signed)
Unable to reach letter sent

## 2014-06-03 ENCOUNTER — Telehealth: Payer: Self-pay

## 2014-06-03 ENCOUNTER — Ambulatory Visit: Payer: Medicare PPO

## 2014-07-26 ENCOUNTER — Ambulatory Visit (INDEPENDENT_AMBULATORY_CARE_PROVIDER_SITE_OTHER): Payer: Medicare Other | Admitting: Emergency Medicine

## 2014-07-26 VITALS — BP 118/60 | HR 81 | Temp 99.9°F | Resp 16 | Ht 68.0 in | Wt 145.0 lb

## 2014-07-26 DIAGNOSIS — N400 Enlarged prostate without lower urinary tract symptoms: Secondary | ICD-10-CM | POA: Diagnosis not present

## 2014-07-26 DIAGNOSIS — F411 Generalized anxiety disorder: Secondary | ICD-10-CM

## 2014-07-26 DIAGNOSIS — I10 Essential (primary) hypertension: Secondary | ICD-10-CM | POA: Diagnosis not present

## 2014-07-26 MED ORDER — PAROXETINE HCL 20 MG PO TABS: 20.0000 mg | ORAL_TABLET | Freq: Every day | ORAL | Status: DC

## 2014-07-26 MED ORDER — LORAZEPAM 0.5 MG PO TABS: 0.5000 mg | ORAL_TABLET | Freq: Three times a day (TID) | ORAL | Status: DC | PRN

## 2014-07-26 MED ORDER — TAMSULOSIN HCL 0.4 MG PO CAPS: 0.4000 mg | ORAL_CAPSULE | Freq: Every day | ORAL | Status: DC

## 2014-07-26 NOTE — Progress Notes (Signed)
[facility] [address], [place] [id number] [phone]  Date:  07/26/2014   Name:  Richard Lucas   DOB:  [birth year]   MRN:  [id number]  PCP:  No primary care provider on file.    Chief Complaint: Anxiety and Urinary Incontinence   History of Present Illness:  Richard Lucas is a 63 y.o. 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  Says that his mind goes blank and he has difficulty finishing the examination in the given time. He says it is not a language proficiency issue. Not depressed.  Only sleeping 4-5 hours due work and school schedule. Has urge incontinence and dribbling of urine.  Some post void burning.  Nocturia.   No improvement with over the counter medications or other home remedies.  Denies other complaint or health concern today.   Patient Active Problem List   Diagnosis Date Noted  . Essential hypertension 07/26/2014  . Immune to varicella 06/03/2012  . Immune to rubella 06/03/2012  . Immune to mumps 06/03/2012  . PPD screening test 01/29/2012    Past Medical History  Diagnosis Date  . Hypertension     No past surgical history on file.  History  Substance Use Topics  . Smoking status: Never Smoker   . Smokeless tobacco: Not on file  . Alcohol Use: Not on file    No family history on file.  No Known Allergies  Medication list has been reviewed and updated.  Current Outpatient Prescriptions on File Prior to Visit  Medication Sig Dispense Refill  . lisinopril-hydrochlorothiazide (PRINZIDE,ZESTORETIC) 20-25 MG per tablet Take 1 tablet by mouth daily. 30 tablet 1   No current facility-administered medications on file prior to visit.    Review of Systems:  As per HPI, otherwise negative.    Physical Examination: Filed Vitals:   07/26/14 1605  BP: 118/60  Pulse: 81  Temp: 99.9 F (37.7 C)  Resp: 16   Filed Vitals:   07/26/14 1605   Height: 5\' 8"  (1.727 m)  Weight: 145 lb (65.772 kg)   Body mass index is 22.05 kg/(m^2). Ideal Body Weight: Weight in (lb) to have BMI = 25: 164.1   GEN: WDWN, NAD, Non-toxic, Alert & Oriented x 3 HEENT: Atraumatic, Normocephalic.  Ears and Nose: No external deformity. EXTR: No clubbing/cyanosis/edema NEURO: Normal gait.  PSYCH: Normally interactive. Conversant. Not depressed or anxious appearing.  Calm demeanor.    Assessment and Plan: Anxiety Prostatism DRE a year ago was normal flomax Ativan paxil    Signed,  Phillips OdorJeffery Nellene Courtois, MD

## 2014-07-26 NOTE — Patient Instructions (Signed)
Benign Prostatic Hyperplasia An enlarged prostate (benign prostatic hyperplasia) is common in older men. You may experience the following:  Weak urine stream.  Dribbling.  Feeling like the bladder has not emptied completely.  Difficulty starting urination.  Getting up frequently at night to urinate.  Urinating more frequently during the day. HOME CARE INSTRUCTIONS  Monitor your prostatic hyperplasia for any changes. The following actions may help to alleviate any discomfort you are experiencing:  Give yourself time when you urinate.  Stay away from alcohol.  Avoid beverages containing caffeine, such as coffee, tea, and colas, because they can make the problem worse.  Avoid decongestants, antihistamines, and some prescription medicines that can make the problem worse.  Follow up with your health care provider for further treatment as recommended. SEEK MEDICAL CARE IF:  You are experiencing progressive difficulty voiding.  Your urine stream is progressively getting narrower.  You are awaking from sleep with the urge to void more frequently.  You are constantly feeling the need to void.  You experience loss of urine, especially in small amounts. SEEK IMMEDIATE MEDICAL CARE IF:   You develop increased pain with urination or are unable to urinate.  You develop severe abdominal pain, vomiting, a high fever, or fainting.  You develop back pain or blood in your urine. MAKE SURE YOU:   Understand these instructions.  Will watch your condition.  Will get help right away if you are not doing well or get worse. Document Released: 08/27/2005 Document Revised: 04/29/2013 Document Reviewed: 01/27/2013 ExitCare Patient Information 2015 [employer]. This information is not intended to replace advice given to you by your health care provider. Make sure you discuss any questions you have with your health care provider.  

## 2014-08-09 ENCOUNTER — Other Ambulatory Visit: Payer: Self-pay | Admitting: Emergency Medicine

## 2015-01-05 ENCOUNTER — Other Ambulatory Visit: Payer: Self-pay

## 2015-01-05 MED ORDER — LISINOPRIL-HYDROCHLOROTHIAZIDE 20-25 MG PO TABS: 1.0000 | ORAL_TABLET | Freq: Every day | ORAL | Status: DC

## 2015-05-20 ENCOUNTER — Other Ambulatory Visit: Payer: Self-pay | Admitting: Emergency Medicine

## 2015-06-01 ENCOUNTER — Other Ambulatory Visit: Payer: Self-pay | Admitting: Emergency Medicine

## 2015-07-18 ENCOUNTER — Other Ambulatory Visit: Payer: Self-pay | Admitting: Physician Assistant

## 2015-07-19 DIAGNOSIS — Z23 Encounter for immunization: Secondary | ICD-10-CM | POA: Diagnosis not present

## 2015-07-21 ENCOUNTER — Other Ambulatory Visit: Payer: Self-pay | Admitting: Emergency Medicine

## 2015-08-11 ENCOUNTER — Ambulatory Visit (INDEPENDENT_AMBULATORY_CARE_PROVIDER_SITE_OTHER): Payer: Medicare Other | Admitting: Physician Assistant

## 2015-08-11 VITALS — BP 118/64 | HR 81 | Temp 97.9°F | Resp 16 | Ht 68.5 in | Wt 162.0 lb

## 2015-08-11 DIAGNOSIS — Z1211 Encounter for screening for malignant neoplasm of colon: Secondary | ICD-10-CM

## 2015-08-11 DIAGNOSIS — L309 Dermatitis, unspecified: Secondary | ICD-10-CM

## 2015-08-11 DIAGNOSIS — Z23 Encounter for immunization: Secondary | ICD-10-CM | POA: Diagnosis not present

## 2015-08-11 DIAGNOSIS — I10 Essential (primary) hypertension: Secondary | ICD-10-CM

## 2015-08-11 LAB — COMPREHENSIVE METABOLIC PANEL
ALBUMIN: 4.4 g/dL (ref 3.6–5.1)
ALT: 47 U/L — ABNORMAL HIGH (ref 9–46)
AST: 37 U/L — AB (ref 10–35)
Alkaline Phosphatase: 60 U/L (ref 40–115)
BUN: 14 mg/dL (ref 7–25)
CHLORIDE: 98 mmol/L (ref 98–110)
CO2: 30 mmol/L (ref 20–31)
CREATININE: 1.12 mg/dL (ref 0.70–1.25)
Calcium: 9.6 mg/dL (ref 8.6–10.3)
GLUCOSE: 87 mg/dL (ref 65–99)
Potassium: 3.8 mmol/L (ref 3.5–5.3)
SODIUM: 140 mmol/L (ref 135–146)
Total Bilirubin: 0.7 mg/dL (ref 0.2–1.2)
Total Protein: 7.5 g/dL (ref 6.1–8.1)

## 2015-08-11 LAB — CBC WITH DIFFERENTIAL/PLATELET
BASOS PCT: 0 % (ref 0–1)
Basophils Absolute: 0 10*3/uL (ref 0.0–0.1)
EOS ABS: 0.2 10*3/uL (ref 0.0–0.7)
EOS PCT: 3 % (ref 0–5)
HCT: 39.6 % (ref 39.0–52.0)
HEMOGLOBIN: 14.1 g/dL (ref 13.0–17.0)
Lymphocytes Relative: 32 % (ref 12–46)
Lymphs Abs: 1.8 10*3/uL (ref 0.7–4.0)
MCH: 29.6 pg (ref 26.0–34.0)
MCHC: 35.6 g/dL (ref 30.0–36.0)
MCV: 83.2 fL (ref 78.0–100.0)
MONO ABS: 0.5 10*3/uL (ref 0.1–1.0)
MPV: 11 fL (ref 8.6–12.4)
Monocytes Relative: 8 % (ref 3–12)
Neutro Abs: 3.2 10*3/uL (ref 1.7–7.7)
Neutrophils Relative %: 57 % (ref 43–77)
PLATELETS: 201 10*3/uL (ref 150–400)
RBC: 4.76 MIL/uL (ref 4.22–5.81)
RDW: 13.7 % (ref 11.5–15.5)
WBC: 5.7 10*3/uL (ref 4.0–10.5)

## 2015-08-11 MED ORDER — ZOSTER VACCINE LIVE 19400 UNT/0.65ML ~~LOC~~ SOLR: 0.6500 mL | Freq: Once | SUBCUTANEOUS | Status: DC

## 2015-08-11 MED ORDER — LISINOPRIL-HYDROCHLOROTHIAZIDE 20-25 MG PO TABS: ORAL_TABLET | ORAL | Status: DC

## 2015-08-11 MED ORDER — TRIAMCINOLONE ACETONIDE 0.025 % EX OINT: 1.0000 "application " | TOPICAL_OINTMENT | Freq: Two times a day (BID) | CUTANEOUS | Status: DC

## 2015-08-11 NOTE — Progress Notes (Signed)
Subjective:    Patient ID: Richard Lucas, male    DOB: Mar 24, 1951, 64 y.o.   MRN: [id number]  Chief Complaint  Patient presents with  . Medication Refill    lisinopril  . left ankle rash/discolored   HPI Patient presents today for refills of his lisinopril-HCTZ and a left ankle rash.  Currently out of his BP medication. Takes 20-25 mg daily without any issues. Checks his BP everyday at home, usually runs in the 120-130/70 range. Denies HA, lightheadedness, chest pain, SOB, palpitations, or syncope.  Left ankle rash started 6 months ago. Located along the medial malleolus. Mainly burns, itches occasionally. Went away temporarily with zinc oxide, but has since returned, not as severe now. Rash has not spread anywhere else and is not associated with any numbness/tingling. Denies fevers, chills, nausea, or vomiting.     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 Previously tried Ativan and Paxil, but didn't like them. Not sure if he will go back to school d/t his age.   Patient is from [place] and has a slight language barrier. No other concerns.   Review of Systems  Constitutional: Negative for fever and chills.  Respiratory: Negative for shortness of breath.   Cardiovascular: Negative for chest pain and palpitations.  Gastrointestinal: Negative for nausea and vomiting.  Skin: Positive for rash (left medial malleolus).  Neurological: Negative for light-headedness, numbness and headaches.   Patient Active Problem List   Diagnosis Date Noted  . Essential hypertension 07/26/2014  . Immune to varicella 06/03/2012  . Immune to rubella 06/03/2012  . Immune to mumps 06/03/2012  . PPD screening test 01/29/2012   History reviewed. No pertinent family history.   Social History   Social History  . Marital Status: Married    Spouse Name: N/A  . Number of Children: N/A  . Years of Education: N/A    Occupational History  . Not on file.   Social History Main Topics  . Smoking status: Never Smoker   . Smokeless tobacco: Not on file  . Alcohol Use: Not on file  . Drug Use: Not on file  . Sexual Activity: Not on file   Other Topics Concern  . Not on file   Social History Narrative   Prior to Admission medications   Medication Sig Start Date End Date Taking? Authorizing Provider  lisinopril-hydrochlorothiazide (PRINZIDE,ZESTORETIC) 20-25 MG tablet TAKE 1 TABLET BY MOUTH ONCE DAILY  "NO MORE REFILLS WITHOUT OFFICE VISIT" 07/22/15  Yes Carmelina Dane, MD   No Known Allergies    Objective:   Physical Exam  Constitutional: He is oriented to person, place, and time. He appears well-developed and well-nourished. No distress.  HENT:  Head: Normocephalic and atraumatic.  Eyes: EOM are normal. No scleral icterus.  Neck: Neck supple.  Cardiovascular: Normal rate, regular rhythm and normal heart sounds.  Exam reveals no gallop and no friction rub.   No murmur heard. 2+ DP and PT pulses b/l.   Pulmonary/Chest: Effort normal and breath sounds normal. No respiratory distress. He has no wheezes. He has no rales.  Lymphadenopathy:    He has no cervical adenopathy.  Neurological: He is alert and oriented to person, place, and time.  Skin: Skin is warm and dry. He is not diaphoretic.  Dry, shiny skin on b/l feet. Slight discoloration behind the medial malleolus of left foot, but no ulcerations or lesions. No warmth  or erythema.   Psychiatric: He has a normal mood and affect. His behavior is normal. Judgment and thought content normal.   BP 118/64 mmHg  Pulse 81  Temp(Src) 97.9 F (36.6 C) (Oral)  Resp 16  Ht 5' 8.5" (1.74 m)  Wt 162 lb (73.483 kg)  BMI 24.27 kg/m2  SpO2 98%     Assessment & Plan:  1. Essential hypertension - Refill lisinopril-HCTZ and continue as prescribed.  - CBC with Differential/Platelet - Comprehensive metabolic panel - TSH -  lisinopril-hydrochlorothiazide (PRINZIDE,ZESTORETIC) 20-25 MG tablet; TAKE 1 TABLET BY MOUTH ONCE DAILY  Dispense: 90 tablet; Refill: 3  2. Eczema - Intact DP and PT pulses b/l. Do not suspect vascular etiology. Will treat as eczema. Apply steroid cream BID to the affected area. Encourage daily moisturizer for both feet.   - triamcinolone (KENALOG) 0.025 % ointment; Apply 1 application topically 2 (two) times daily.  Dispense: 30 g; Refill: 0  3. Need for Tdap vaccination - Tdap vaccine greater than or equal to 7yo IM  4. Need for pneumococcal vaccination - Pneumococcal conjugate vaccine 13-valent IM  5. Need for shingles vaccine - zoster vaccine live, PF, (ZOSTAVAX) [id number] UNT/0.65ML injection; Inject 19,400 Units into the skin once.  Dispense: 1 each; Refill: 0  6. Screening for colon cancer - Patient interested in capsule endoscopy. Not comfortable with sedation involved in colonoscopy.  - Ambulatory referral to Gastroenterology

## 2015-08-11 NOTE — Patient Instructions (Signed)
I will contact you with your lab results as soon as they are available.   If you have not heard from me in 2 weeks, please contact me.  The fastest way to get your results is to register for My Chart (see the instructions on the last page of this printout).   

## 2015-08-11 NOTE — Progress Notes (Signed)
Patient ID: Richard Lucas, male    DOB: 1951/08/16, 64 y.o.   MRN: [id number]  PCP: No primary care provider on file.  Subjective:   Chief Complaint  Patient presents with  . Medication Refill    lisinopril  . left ankle rash/discolored    HPI Patient presents today for refills of his lisinopril-HCTZ and a left ankle rash.   Currently out of his BP medication. Takes LisinoprilHCT 20-25 mg daily without any issues. Checks his BP everyday at home, usually runs in the 120-130/70 range. Denies HA, lightheadedness, chest pain, SOB, palpitations, or syncope.   Left ankle rash started 6 months ago. Located along the medial malleolus. Mainly burns, itches occasionally. Went away temporarily with zinc oxide, but has since returned, not as severe now. Rash has not spread anywhere else and is not associated with any numbness/tingling. Denies fevers, chills, nausea, or vomiting.   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 Previously tried Ativan and Paxil, but didn't like them. Not sure if he will go back to school d/t his age.   Patient is from [place] and has a slight language barrier. No other concerns.    Review of Systems Constitutional: Negative for fever and chills.  Respiratory: Negative for shortness of breath.  Cardiovascular: Negative for chest pain and palpitations.  Gastrointestinal: Negative for nausea and vomiting.  Skin: Positive for rash (left medial malleolus).      Patient Active Problem List   Diagnosis Date Noted  . Essential hypertension 07/26/2014  . Immune to varicella 06/03/2012  . Immune to rubella 06/03/2012  . Immune to mumps 06/03/2012  . PPD screening test 01/29/2012     Prior to Admission medications   Medication Sig Start Date End Date Taking? Authorizing Provider  lisinopril-hydrochlorothiazide (PRINZIDE,ZESTORETIC) 20-25 MG tablet TAKE 1 TABLET BY MOUTH ONCE DAILY   "NO MORE REFILLS WITHOUT OFFICE VISIT" 07/22/15  Yes Carmelina DaneJeffery S Anderson, MD     No Known Allergies     Objective:  Physical Exam  Constitutional: He is oriented to person, place, and time. Vital signs are normal. He appears well-developed and well-nourished. He is active and cooperative. No distress.  BP 118/64 mmHg  Pulse 81  Temp(Src) 97.9 F (36.6 C) (Oral)  Resp 16  Ht 5' 8.5" (1.74 m)  Wt 162 lb (73.483 kg)  BMI 24.27 kg/m2  SpO2 98%  HENT:  Head: Normocephalic and atraumatic.  Right Ear: Hearing normal.  Left Ear: Hearing normal.  Eyes: Conjunctivae are normal. No scleral icterus.  Neck: Normal range of motion. Neck supple. No thyromegaly present.  Cardiovascular: Normal rate, regular rhythm and normal heart sounds.   Pulses:      Radial pulses are 2+ on the right side, and 2+ on the left side.  Pulmonary/Chest: Effort normal and breath sounds normal.  Lymphadenopathy:       Head (right side): No tonsillar, no preauricular, no posterior auricular and no occipital adenopathy present.       Head (left side): No tonsillar, no preauricular, no posterior auricular and no occipital adenopathy present.    He has no cervical adenopathy.       Right: No supraclavicular adenopathy present.       Left: No supraclavicular adenopathy present.  Neurological: He is alert and oriented to person, place, and time. No sensory deficit.  Skin: Skin is warm, dry and intact. No rash noted. No  cyanosis or erythema. Nails show no clubbing.  Very dry skin on both feet and lower legs. Area of hyperpigmentation posterior to the medial malleolus and on the dorsum of the LEFT foot just proximal to the toes.  Psychiatric: He has a normal mood and affect.           Assessment & Plan:   1. Essential hypertension Controlled. Continue current treatment. Re-evaluate in 6 months. - CBC with Differential/Platelet - Comprehensive metabolic panel - TSH - lisinopril-hydrochlorothiazide  (PRINZIDE,ZESTORETIC) 20-25 MG tablet; TAKE 1 TABLET BY MOUTH ONCE DAILY  Dispense: 90 tablet; Refill: 3  2. Eczema Discussed skin hygiene for dry skin. Steroid cream BID x 1-2 weeks. If continues, RTC. - triamcinolone (KENALOG) 0.025 % ointment; Apply 1 application topically 2 (two) times daily.  Dispense: 30 g; Refill: 0  3. Need for Tdap vaccination - Tdap vaccine greater than or equal to 7yo IM  4. Need for pneumococcal vaccination Needs pneumovax in 1 year. - Pneumococcal conjugate vaccine 13-valent IM  5. Need for shingles vaccine - zoster vaccine live, PF, (ZOSTAVAX) 16109 UNT/0.65ML injection; Inject 19,400 Units into the skin once.  Dispense: 1 each; Refill: 0  6. Screening for colon cancer He is not interested in colonoscopy, but will consider capsule endoscopy. - Ambulatory referral to Gastroenterology   Fernande Bras, PA-C Physician Assistant-Certified [facility]

## 2015-08-12 ENCOUNTER — Encounter: Payer: Self-pay | Admitting: Physician Assistant

## 2015-08-12 LAB — TSH: TSH: 3.628 u[IU]/mL (ref 0.350–4.500)

## 2015-08-13 NOTE — Progress Notes (Signed)
  Medical screening examination/treatment/procedure(s) were performed by non-physician practitioner and as supervising physician I was immediately available for consultation/collaboration.     

## 2015-12-19 ENCOUNTER — Encounter: Payer: Self-pay | Admitting: Internal Medicine

## 2015-12-19 ENCOUNTER — Ambulatory Visit (INDEPENDENT_AMBULATORY_CARE_PROVIDER_SITE_OTHER): Payer: Commercial Managed Care - HMO | Admitting: Internal Medicine

## 2015-12-19 VITALS — BP 130/66 | HR 76 | Temp 98.3°F | Resp 14 | Ht 68.0 in | Wt 163.8 lb

## 2015-12-19 DIAGNOSIS — H547 Unspecified visual loss: Secondary | ICD-10-CM | POA: Diagnosis not present

## 2015-12-19 DIAGNOSIS — N529 Male erectile dysfunction, unspecified: Secondary | ICD-10-CM | POA: Diagnosis not present

## 2015-12-19 DIAGNOSIS — I10 Essential (primary) hypertension: Secondary | ICD-10-CM | POA: Diagnosis not present

## 2015-12-19 NOTE — Assessment & Plan Note (Signed)
Refer to urology. He does not want to try medicine in the meantime. Last PSA several years ago negative. Previous medication flomax in his list but denies BPH symptoms today.

## 2015-12-19 NOTE — Patient Instructions (Signed)
We will not check any labs today.   We can see you back in about 6-12 months for a check up.   We have sent in the referral to your eye doctor and you can call and schedule.   We will also have the urologist call you about the reproductive system.   Health Maintenance, Male A healthy lifestyle and preventative care can promote health and wellness.  Maintain regular health, dental, and eye exams.  Eat a healthy diet. Foods like vegetables, fruits, whole grains, low-fat dairy products, and lean protein foods contain the nutrients you need and are low in calories. Decrease your intake of foods high in solid fats, added sugars, and salt. Get information about a proper diet from your health care provider, if necessary.  Regular physical exercise is one of the most important things you can do for your health. Most adults should get at least 150 minutes of moderate-intensity exercise (any activity that increases your heart rate and causes you to sweat) each week. In addition, most adults need muscle-strengthening exercises on 2 or more days a week.   Maintain a healthy weight. The body mass index (BMI) is a screening tool to identify possible weight problems. It provides an estimate of body fat based on height and weight. Your health care provider can find your BMI and can help you achieve or maintain a healthy weight. For males 20 years and older:  A BMI below 18.5 is considered underweight.  A BMI of 18.5 to 24.9 is normal.  A BMI of 25 to 29.9 is considered overweight.  A BMI of 30 and above is considered obese.  Maintain normal blood lipids and cholesterol by exercising and minimizing your intake of saturated fat. Eat a balanced diet with plenty of fruits and vegetables. Blood tests for lipids and cholesterol should begin at age 67 and be repeated every 5 years. If your lipid or cholesterol levels are high, you are over age 67, or you are at high risk for heart disease, you may need your  cholesterol levels checked more frequently.Ongoing high lipid and cholesterol levels should be treated with medicines if diet and exercise are not working.  If you smoke, find out from your health care provider how to quit. If you do not use tobacco, do not start.  Lung cancer screening is recommended for adults aged 55-80 years who are at high risk for developing lung cancer because of a history of smoking. A yearly low-dose CT scan of the lungs is recommended for people who have at least a 30-pack-year history of smoking and are current smokers or have quit within the past 15 years. A pack year of smoking is smoking an average of 1 pack of cigarettes a day for 1 year (for example, a 30-pack-year history of smoking could mean smoking 1 pack a day for 30 years or 2 packs a day for 15 years). Yearly screening should continue until the smoker has stopped smoking for at least 15 years. Yearly screening should be stopped for people who develop a health problem that would prevent them from having lung cancer treatment.  If you choose to drink alcohol, do not have more than 2 drinks per day. One drink is considered to be 12 oz (360 mL) of beer, 5 oz (150 mL) of wine, or 1.5 oz (45 mL) of liquor.  Avoid the use of street drugs. Do not share needles with anyone. Ask for help if you need support or instructions about stopping  the use of drugs.  High blood pressure causes heart disease and increases the risk of stroke. High blood pressure is more likely to develop in:  People who have blood pressure in the end of the normal range (100-139/85-89 mm Hg).  People who are overweight or obese.  People who are African American.  If you are 32-3 years of age, have your blood pressure checked every 3-5 years. If you are 75 years of age or older, have your blood pressure checked every year. You should have your blood pressure measured twice--once when you are at a hospital or clinic, and once when you are not at a  hospital or clinic. Record the average of the two measurements. To check your blood pressure when you are not at a hospital or clinic, you can use:  An automated blood pressure machine at a pharmacy.  A home blood pressure monitor.  If you are 36-51 years old, ask your health care provider if you should take aspirin to prevent heart disease.  Diabetes screening involves taking a blood sample to check your fasting blood sugar level. This should be done once every 3 years after age 41 if you are at a normal weight and without risk factors for diabetes. Testing should be considered at a younger age or be carried out more frequently if you are overweight and have at least 1 risk factor for diabetes.  Colorectal cancer can be detected and often prevented. Most routine colorectal cancer screening begins at the age of 79 and continues through age 39. However, your health care provider may recommend screening at an earlier age if you have risk factors for colon cancer. On a yearly basis, your health care provider may provide home test kits to check for hidden blood in the stool. A small camera at the end of a tube may be used to directly examine the colon (sigmoidoscopy or colonoscopy) to detect the earliest forms of colorectal cancer. Talk to your health care provider about this at age 49 when routine screening begins. A direct exam of the colon should be repeated every 5-10 years through age 28, unless early forms of precancerous polyps or small growths are found.  People who are at an increased risk for hepatitis B should be screened for this virus. You are considered at high risk for hepatitis B if:  You were born in a country where hepatitis B occurs often. Talk with your health care provider about which countries are considered high risk.  Your parents were born in a high-risk country and you have not received a shot to protect against hepatitis B (hepatitis B vaccine).  You have HIV or AIDS.  You  use needles to inject street drugs.  You live with, or have sex with, someone who has hepatitis B.  You are a man who has sex with other men (MSM).  You get hemodialysis treatment.  You take certain medicines for conditions like cancer, organ transplantation, and autoimmune conditions.  Hepatitis C blood testing is recommended for all people born from [birth year] through 1967 and any individual with known risk factors for hepatitis C.  Healthy men should no longer receive prostate-specific antigen (PSA) blood tests as part of routine cancer screening. Talk to your health care provider about prostate cancer screening.  Testicular cancer screening is not recommended for adolescents or adult males who have no symptoms. Screening includes self-exam, a health care provider exam, and other screening tests. Consult with your health care provider about any  symptoms you have or any concerns you have about testicular cancer.  Practice safe sex. Use condoms and avoid high-risk sexual practices to reduce the spread of sexually transmitted infections (STIs).  You should be screened for STIs, including gonorrhea and chlamydia if:  You are sexually active and are younger than 24 years.  You are older than 24 years, and your health care provider tells you that you are at risk for this type of infection.  Your sexual activity has changed since you were last screened, and you are at an increased risk for chlamydia or gonorrhea. Ask your health care provider if you are at risk.  If you are at risk of being infected with HIV, it is recommended that you take a prescription medicine daily to prevent HIV infection. This is called pre-exposure prophylaxis (PrEP). You are considered at risk if:  You are a man who has sex with other men (MSM).  You are a heterosexual man who is sexually active with multiple partners.  You take drugs by injection.  You are sexually active with a partner who has HIV.  Talk with  your health care provider about whether you are at high risk of being infected with HIV. If you choose to begin PrEP, you should first be tested for HIV. You should then be tested every 3 months for as long as you are taking PrEP.  Use sunscreen. Apply sunscreen liberally and repeatedly throughout the day. You should seek shade when your shadow is shorter than you. Protect yourself by wearing long sleeves, pants, a wide-brimmed hat, and sunglasses year round whenever you are outdoors.  Tell your health care provider of new moles or changes in moles, especially if there is a change in shape or color. Also, tell your health care provider if a mole is larger than the size of a pencil eraser.  A one-time screening for abdominal aortic aneurysm (AAA) and surgical repair of large AAAs by ultrasound is recommended for men aged 47-75 years who are current or former smokers.  Stay current with your vaccines (immunizations).   This information is not intended to replace advice given to you by your health care provider. Make sure you discuss any questions you have with your health care provider.   Document Released: 02/23/2008 Document Revised: 09/17/2014 Document Reviewed: 01/22/2011 Elsevier Interactive Patient Education [employer].

## 2015-12-19 NOTE — Progress Notes (Signed)
   Subjective:    Patient ID: Richard Lucas, male    DOB: 1951-08-26, 65 y.o.   MRN: [id number]  HPI The patient is a 65 YO man coming in new for erectile dysfunction. He has been having problems off and on for about 4 years. He is sometimes able to have an erection and other times the erection is not sufficient. He does have high blood pressure and takes his medicine regularly. Denies headaches or chest pains. Does not exercise right now. No other concerns. Denies hesitancy or urgency with urination.   [facility], social history reviewed and updated.   Review of Systems  Constitutional: Negative for fever, activity change, appetite change, fatigue and unexpected weight change.  HENT: Negative.   Eyes: Negative.   Respiratory: Negative for cough, chest tightness, shortness of breath and wheezing.   Cardiovascular: Negative for chest pain, palpitations and leg swelling.  Gastrointestinal: Negative for nausea, abdominal pain, diarrhea, constipation, blood in stool and abdominal distention.  Genitourinary:       Erectile dysfunction  Musculoskeletal: Negative for myalgias, back pain and arthralgias.  Skin: Negative.   Neurological: Negative.   Psychiatric/Behavioral: Negative.       Objective:   Physical Exam  Constitutional: He is oriented to person, place, and time. He appears well-developed and well-nourished.  HENT:  Head: Normocephalic and atraumatic.  Eyes: EOM are normal.  Neck: Normal range of motion.  Cardiovascular: Normal rate and regular rhythm.   Carotids without bruit bilaterally  Pulmonary/Chest: Effort normal and breath sounds normal. No respiratory distress. He has no wheezes. He has no rales.  Abdominal: Soft. Bowel sounds are normal. He exhibits no distension. There is no tenderness. There is no rebound.  Musculoskeletal: He exhibits no edema.  Neurological: He is alert and oriented to person, place, and time. Coordination normal.  Skin: Skin is warm and dry.    Psychiatric: He has a normal mood and affect.   Filed Vitals:   12/19/15 1410  BP: 130/66  Pulse: 76  Temp: 98.3 F (36.8 C)  TempSrc: Oral  Resp: 14  Height: 5\' 8"  (1.727 m)  Weight: 163 lb 12.8 oz (74.299 kg)  SpO2: 93%      Assessment & Plan:

## 2015-12-19 NOTE — Assessment & Plan Note (Signed)
BP at goal on lisinopril/hctz. Recent CMP at goal without indication for change.

## 2015-12-19 NOTE — Progress Notes (Signed)
Pre visit review using our clinic review tool, if applicable. No additional management support is needed unless otherwise documented below in the visit note. 

## 2016-07-16 ENCOUNTER — Other Ambulatory Visit: Payer: Self-pay | Admitting: Physician Assistant

## 2016-07-16 DIAGNOSIS — I10 Essential (primary) hypertension: Secondary | ICD-10-CM

## 2016-07-18 NOTE — Telephone Encounter (Signed)
12/2015 last ov 

## 2016-07-25 ENCOUNTER — Ambulatory Visit (INDEPENDENT_AMBULATORY_CARE_PROVIDER_SITE_OTHER): Payer: Commercial Managed Care - HMO | Admitting: Internal Medicine

## 2016-07-25 ENCOUNTER — Encounter: Payer: Self-pay | Admitting: Internal Medicine

## 2016-07-25 ENCOUNTER — Other Ambulatory Visit (INDEPENDENT_AMBULATORY_CARE_PROVIDER_SITE_OTHER): Payer: Commercial Managed Care - HMO

## 2016-07-25 VITALS — BP 134/72 | HR 67 | Temp 98.5°F | Resp 12 | Ht 68.0 in | Wt 163.0 lb

## 2016-07-25 DIAGNOSIS — I10 Essential (primary) hypertension: Secondary | ICD-10-CM

## 2016-07-25 DIAGNOSIS — Z0000 Encounter for general adult medical examination without abnormal findings: Secondary | ICD-10-CM | POA: Diagnosis not present

## 2016-07-25 DIAGNOSIS — R196 Halitosis: Secondary | ICD-10-CM | POA: Diagnosis not present

## 2016-07-25 LAB — CBC
HEMATOCRIT: 44.6 % (ref 39.0–52.0)
Hemoglobin: 15 g/dL (ref 13.0–17.0)
MCHC: 33.7 g/dL (ref 30.0–36.0)
MCV: 85.4 fl (ref 78.0–100.0)
Platelets: 155 10*3/uL (ref 150.0–400.0)
RBC: 5.22 Mil/uL (ref 4.22–5.81)
RDW: 14.7 % (ref 11.5–15.5)
WBC: 5.2 10*3/uL (ref 4.0–10.5)

## 2016-07-25 LAB — LIPID PANEL
CHOL/HDL RATIO: 4
Cholesterol: 186 mg/dL (ref 0–200)
HDL: 42.9 mg/dL (ref >39.00)
LDL CALC: 115 mg/dL — AB (ref 0–99)
NONHDL: 143.3
Triglycerides: 142 mg/dL (ref 0.0–149.0)
VLDL: 28.4 mg/dL (ref 0.0–40.0)

## 2016-07-25 LAB — COMPREHENSIVE METABOLIC PANEL
ALBUMIN: 4.6 g/dL (ref 3.5–5.2)
ALK PHOS: 52 U/L (ref 39–117)
ALT: 44 U/L (ref 0–53)
AST: 30 U/L (ref 0–37)
BUN: 13 mg/dL (ref 6–23)
CHLORIDE: 100 meq/L (ref 96–112)
CO2: 33 mEq/L — ABNORMAL HIGH (ref 19–32)
CREATININE: 1.25 mg/dL (ref 0.40–1.50)
Calcium: 9.7 mg/dL (ref 8.4–10.5)
GFR: 73.94 mL/min (ref >60.00)
Glucose, Bld: 95 mg/dL (ref 70–99)
Potassium: 3.8 mEq/L (ref 3.5–5.1)
SODIUM: 141 meq/L (ref 135–145)
TOTAL PROTEIN: 7.5 g/dL (ref 6.0–8.3)
Total Bilirubin: 1.3 mg/dL — ABNORMAL HIGH (ref 0.2–1.2)

## 2016-07-25 LAB — HEMOGLOBIN A1C: Hgb A1c MFr Bld: 5.2 % (ref 4.6–6.5)

## 2016-07-25 NOTE — Progress Notes (Signed)
Pre visit review using our clinic review tool, if applicable. No additional management support is needed unless otherwise documented below in the visit note. 

## 2016-07-25 NOTE — Progress Notes (Signed)
   Subjective:    Patient ID: Richard Lucas, male    DOB: 1951-01-24, 65 y.o.   MRN: [id number]  HPI The patient is a 65 YO man coming in for follow up of his blood pressure. He is still taking his medication without side effects. He has mild cough rarely but not bothersome to him. No chest pains or headaches. He is not exercising lately but has worked on less salt in his food.   Review of Systems  Constitutional: Negative.   Respiratory: Negative.   Cardiovascular: Negative.   Gastrointestinal: Negative.   Musculoskeletal: Negative.   Skin: Negative.       Objective:   Physical Exam  Constitutional: He is oriented to person, place, and time. He appears well-developed and well-nourished.  HENT:  Head: Normocephalic and atraumatic.  Eyes: EOM are normal.  Neck: Normal range of motion.  Cardiovascular: Normal rate and regular rhythm.   Pulmonary/Chest: Effort normal. No respiratory distress. He has no wheezes. He has no rales.  Abdominal: Soft. Bowel sounds are normal. He exhibits no distension. There is no tenderness. There is no rebound.  Neurological: He is alert and oriented to person, place, and time.  Skin: Skin is warm and dry.   Vitals:   07/25/16 1051  BP: 134/72  Pulse: 67  Resp: 12  Temp: 98.5 F (36.9 C)  TempSrc: Oral  SpO2: 98%  Weight: 163 lb (73.9 kg)  Height: 5\' 8"  (1.727 m)      Assessment & Plan:

## 2016-07-25 NOTE — Patient Instructions (Signed)
We will check the labs today and send the results on the mychart.   You are doing great with the health so keep up the good work.

## 2016-07-26 ENCOUNTER — Encounter: Payer: Self-pay | Admitting: Internal Medicine

## 2016-07-26 NOTE — Assessment & Plan Note (Signed)
Taking lisinopril/hctz and BO at goal, mild cough which could be related to lisinopril but not bothersome to him so no indication to change today. Checking CMP.

## 2017-03-28 DIAGNOSIS — N486 Induration penis plastica: Secondary | ICD-10-CM | POA: Diagnosis not present

## 2017-03-28 DIAGNOSIS — N5201 Erectile dysfunction due to arterial insufficiency: Secondary | ICD-10-CM | POA: Diagnosis not present

## 2017-04-28 ENCOUNTER — Other Ambulatory Visit: Payer: Self-pay | Admitting: Internal Medicine

## 2017-04-28 DIAGNOSIS — I10 Essential (primary) hypertension: Secondary | ICD-10-CM

## 2017-04-29 ENCOUNTER — Other Ambulatory Visit: Payer: Self-pay

## 2017-04-29 DIAGNOSIS — I10 Essential (primary) hypertension: Secondary | ICD-10-CM

## 2017-04-29 MED ORDER — LISINOPRIL-HYDROCHLOROTHIAZIDE 20-25 MG PO TABS: 1.0000 | ORAL_TABLET | Freq: Every day | ORAL | 0 refills | Status: DC

## 2017-04-30 ENCOUNTER — Other Ambulatory Visit: Payer: Self-pay | Admitting: Internal Medicine

## 2017-04-30 DIAGNOSIS — I10 Essential (primary) hypertension: Secondary | ICD-10-CM

## 2017-05-28 ENCOUNTER — Ambulatory Visit: Payer: Commercial Managed Care - HMO | Admitting: Internal Medicine

## 2017-06-18 ENCOUNTER — Encounter: Payer: Self-pay | Admitting: Internal Medicine

## 2017-06-18 ENCOUNTER — Other Ambulatory Visit (INDEPENDENT_AMBULATORY_CARE_PROVIDER_SITE_OTHER): Payer: Medicare HMO

## 2017-06-18 ENCOUNTER — Ambulatory Visit (INDEPENDENT_AMBULATORY_CARE_PROVIDER_SITE_OTHER): Payer: Medicare HMO | Admitting: Internal Medicine

## 2017-06-18 VITALS — BP 118/70 | HR 72 | Temp 98.6°F | Ht 68.0 in | Wt 163.0 lb

## 2017-06-18 DIAGNOSIS — Z0000 Encounter for general adult medical examination without abnormal findings: Secondary | ICD-10-CM | POA: Insufficient documentation

## 2017-06-18 DIAGNOSIS — Z23 Encounter for immunization: Secondary | ICD-10-CM

## 2017-06-18 DIAGNOSIS — I10 Essential (primary) hypertension: Secondary | ICD-10-CM

## 2017-06-18 LAB — LIPID PANEL
CHOLESTEROL: 175 mg/dL (ref 0–200)
HDL: 41.6 mg/dL (ref >39.00)
LDL Cholesterol: 109 mg/dL — ABNORMAL HIGH (ref 0–99)
NonHDL: 133.56
Total CHOL/HDL Ratio: 4
Triglycerides: 123 mg/dL (ref 0.0–149.0)
VLDL: 24.6 mg/dL (ref 0.0–40.0)

## 2017-06-18 LAB — CBC
HCT: 42.6 % (ref 39.0–52.0)
HEMOGLOBIN: 14.3 g/dL (ref 13.0–17.0)
MCHC: 33.6 g/dL (ref 30.0–36.0)
MCV: 87.8 fl (ref 78.0–100.0)
Platelets: 160 10*3/uL (ref 150.0–400.0)
RBC: 4.85 Mil/uL (ref 4.22–5.81)
RDW: 13.8 % (ref 11.5–15.5)
WBC: 4.3 10*3/uL (ref 4.0–10.5)

## 2017-06-18 LAB — COMPREHENSIVE METABOLIC PANEL
ALBUMIN: 4.5 g/dL (ref 3.5–5.2)
ALK PHOS: 51 U/L (ref 39–117)
ALT: 39 U/L (ref 0–53)
AST: 26 U/L (ref 0–37)
BUN: 9 mg/dL (ref 6–23)
CO2: 33 mEq/L — ABNORMAL HIGH (ref 19–32)
Calcium: 9.6 mg/dL (ref 8.4–10.5)
Chloride: 99 mEq/L (ref 96–112)
Creatinine, Ser: 1.14 mg/dL (ref 0.40–1.50)
GFR: 82.01 mL/min (ref >60.00)
Glucose, Bld: 105 mg/dL — ABNORMAL HIGH (ref 70–99)
POTASSIUM: 3.7 meq/L (ref 3.5–5.1)
SODIUM: 140 meq/L (ref 135–145)
TOTAL PROTEIN: 7.4 g/dL (ref 6.0–8.3)
Total Bilirubin: 1 mg/dL (ref 0.2–1.2)

## 2017-06-18 MED ORDER — LISINOPRIL-HYDROCHLOROTHIAZIDE 20-25 MG PO TABS: 1.0000 | ORAL_TABLET | Freq: Every day | ORAL | 3 refills | Status: DC

## 2017-06-18 NOTE — Assessment & Plan Note (Signed)
Pneumonia vaccine given at visit to complete series. Flu vaccine declined today. Tetanus up to date. Counseled about the shingles vaccine. Counseled about colonoscopy but he declines screening today. Given 10 year screening recommendations.

## 2017-06-18 NOTE — Patient Instructions (Addendum)
We have given you the pneumonia shot today.  We will check the labs and have sent in the medicine today for you.    Health Maintenance, Male A healthy lifestyle and preventive care is important for your health and wellness. Ask your health care provider about what schedule of regular examinations is right for you. What should I know about weight and diet? Eat a Healthy Diet  Eat plenty of vegetables, fruits, whole grains, low-fat dairy products, and lean protein.  Do not eat a lot of foods high in solid fats, added sugars, or salt.  Maintain a Healthy Weight Regular exercise can help you achieve or maintain a healthy weight. You should:  Do at least 150 minutes of exercise each week. The exercise should increase your heart rate and make you sweat (moderate-intensity exercise).  Do strength-training exercises at least twice a week.  Watch Your Levels of Cholesterol and Blood Lipids  Have your blood tested for lipids and cholesterol every 5 years starting at 68 years of age. If you are at high risk for heart disease, you should start having your blood tested when you are 68 years old. You may need to have your cholesterol levels checked more often if: ? Your lipid or cholesterol levels are high. ? You are older than 68 years of age. ? You are at high risk for heart disease.  What should I know about cancer screening? Many types of cancers can be detected early and may often be prevented. Lung Cancer  You should be screened every year for lung cancer if: ? You are a current smoker who has smoked for at least 30 years. ? You are a former smoker who has quit within the past 15 years.  Talk to your health care provider about your screening options, when you should start screening, and how often you should be screened.  Colorectal Cancer  Routine colorectal cancer screening usually begins at 68 years of age and should be repeated every 5-10 years until you are 68 years old. You may  need to be screened more often if early forms of precancerous polyps or small growths are found. Your health care provider may recommend screening at an earlier age if you have risk factors for colon cancer.  Your health care provider may recommend using home test kits to check for hidden blood in the stool.  A small camera at the end of a tube can be used to examine your colon (sigmoidoscopy or colonoscopy). This checks for the earliest forms of colorectal cancer.  Prostate and Testicular Cancer  Depending on your age and overall health, your health care provider may do certain tests to screen for prostate and testicular cancer.  Talk to your health care provider about any symptoms or concerns you have about testicular or prostate cancer.  Skin Cancer  Check your skin from head to toe regularly.  Tell your health care provider about any new moles or changes in moles, especially if: ? There is a change in a mole's size, shape, or color. ? You have a mole that is larger than a pencil eraser.  Always use sunscreen. Apply sunscreen liberally and repeat throughout the day.  Protect yourself by wearing long sleeves, pants, a wide-brimmed hat, and sunglasses when outside.  What should I know about heart disease, diabetes, and high blood pressure?  If you are 41-14 years of age, have your blood pressure checked every 3-5 years. If you are 55 years of age or older,  have your blood pressure checked every year. You should have your blood pressure measured twice-once when you are at a hospital or clinic, and once when you are not at a hospital or clinic. Record the average of the two measurements. To check your blood pressure when you are not at a hospital or clinic, you can use: ? An automated blood pressure machine at a pharmacy. ? A home blood pressure monitor.  Talk to your health care provider about your target blood pressure.  If you are between 48-48 years old, ask your health care  provider if you should take aspirin to prevent heart disease.  Have regular diabetes screenings by checking your fasting blood sugar level. ? If you are at a normal weight and have a low risk for diabetes, have this test once every three years after the age of 61. ? If you are overweight and have a high risk for diabetes, consider being tested at a younger age or more often.  A one-time screening for abdominal aortic aneurysm (AAA) by ultrasound is recommended for men aged 69-75 years who are current or former smokers. What should I know about preventing infection? Hepatitis B If you have a higher risk for hepatitis B, you should be screened for this virus. Talk with your health care provider to find out if you are at risk for hepatitis B infection. Hepatitis C Blood testing is recommended for:  Everyone born from [birth year] through 1967.  Anyone with known risk factors for hepatitis C.  Sexually Transmitted Diseases (STDs)  You should be screened each year for STDs including gonorrhea and chlamydia if: ? You are sexually active and are younger than 68 years of age. ? You are older than 68 years of age and your health care provider tells you that you are at risk for this type of infection. ? Your sexual activity has changed since you were last screened and you are at an increased risk for chlamydia or gonorrhea. Ask your health care provider if you are at risk.  Talk with your health care provider about whether you are at high risk of being infected with HIV. Your health care provider may recommend a prescription medicine to help prevent HIV infection.  What else can I do?  Schedule regular health, dental, and eye exams.  Stay current with your vaccines (immunizations).  Do not use any tobacco products, such as cigarettes, chewing tobacco, and e-cigarettes. If you need help quitting, ask your health care provider.  Limit alcohol intake to no more than 2 drinks per day. One drink equals 12  ounces of beer, 5 ounces of wine, or 1 ounces of hard liquor.  Do not use street drugs.  Do not share needles.  Ask your health care provider for help if you need support or information about quitting drugs.  Tell your health care provider if you often feel depressed.  Tell your health care provider if you have ever been abused or do not feel safe at home. This information is not intended to replace advice given to you by your health care provider. Make sure you discuss any questions you have with your health care provider. Document Released: 02/23/2008 Document Revised: 04/25/2016 Document Reviewed: 05/31/2015 Elsevier Interactive Patient Education  [employer].

## 2017-06-18 NOTE — Assessment & Plan Note (Signed)
Taking lisinopril/hctz and BP at goal. Checking CMP and adjust as needed.  

## 2017-06-18 NOTE — Progress Notes (Signed)
   Subjective:    Patient ID: Richard Lucas, male    DOB: [birth year], 66 y.o.   MRN: [id number]  HPI Here for medicare wellness and physical, no new complaints. Please see A/P for status and treatment of chronic medical problems.   Diet: heart healthy Physical activity: sedentary Depression/mood screen: negative Hearing: intact to whispered voice Visual acuity: grossly normal with lens, performs annual eye exam  ADLs: capable Fall risk: none Home safety: good Cognitive evaluation: intact to orientation, naming, recall and repetition EOL planning: adv directives discussed  I have personally reviewed and have noted 1. The patient's medical and social history - reviewed today no changes 2. Their use of alcohol, tobacco or illicit drugs 3. Their current medications and supplements 4. The patient's functional ability including ADL's, fall risks, home safety risks and hearing or visual impairment. 5. Diet and physical activities 6. Evidence for depression or mood disorders 7. Care team reviewed and updated (available in snapshot)  Review of Systems  Constitutional: Negative.   HENT: Negative.   Eyes: Negative.   Respiratory: Negative for cough, chest tightness and shortness of breath.   Cardiovascular: Negative for chest pain, palpitations and leg swelling.  Gastrointestinal: Negative for abdominal distention, abdominal pain, constipation, diarrhea, nausea and vomiting.  Musculoskeletal: Negative.   Skin: Negative.   Neurological: Negative.   Psychiatric/Behavioral: Negative.       Objective:   Physical Exam  Constitutional: He is oriented to person, place, and time. He appears well-developed and well-nourished.  HENT:  Head: Normocephalic and atraumatic.  Eyes: EOM are normal.  Neck: Normal range of motion.  Cardiovascular: Normal rate and regular rhythm.   Carotid without bruit  Pulmonary/Chest: Effort normal and breath sounds normal. No respiratory distress. He has no  wheezes. He has no rales.  Abdominal: Soft. Bowel sounds are normal. He exhibits no distension. There is no tenderness. There is no rebound.  Musculoskeletal: He exhibits no edema.  Neurological: He is alert and oriented to person, place, and time. Coordination normal.  Skin: Skin is warm and dry.  Psychiatric: He has a normal mood and affect.   Vitals:   06/18/17 1021  BP: 118/70  Pulse: 72  Temp: 98.6 F (37 C)  TempSrc: Oral  SpO2: 99%  Weight: 163 lb (73.9 kg)  Height:  (1.727 m)      Assessment & Plan:  Pneumonia 23 given at visit

## 2017-06-30 DIAGNOSIS — R69 Illness, unspecified: Secondary | ICD-10-CM | POA: Diagnosis not present

## 2017-11-04 ENCOUNTER — Emergency Department (HOSPITAL_COMMUNITY)
Admission: EM | Admit: 2017-11-04 | Discharge: 2017-11-04 | Disposition: A | Discharge status: Home / Self Care | Payer: Medicare HMO | Attending: Emergency Medicine | Admitting: Emergency Medicine

## 2017-11-04 ENCOUNTER — Encounter (HOSPITAL_COMMUNITY): Payer: Self-pay | Admitting: Emergency Medicine

## 2017-11-04 DIAGNOSIS — S63502A Unspecified sprain of left wrist, initial encounter: Secondary | ICD-10-CM | POA: Insufficient documentation

## 2017-11-04 DIAGNOSIS — S6392XA Sprain of unspecified part of left wrist and hand, initial encounter: Secondary | ICD-10-CM | POA: Diagnosis not present

## 2017-11-04 DIAGNOSIS — Y999 Unspecified external cause status: Secondary | ICD-10-CM | POA: Diagnosis not present

## 2017-11-04 DIAGNOSIS — R51 Headache: Secondary | ICD-10-CM | POA: Insufficient documentation

## 2017-11-04 DIAGNOSIS — Y939 Activity, unspecified: Secondary | ICD-10-CM | POA: Insufficient documentation

## 2017-11-04 DIAGNOSIS — Y929 Unspecified place or not applicable: Secondary | ICD-10-CM | POA: Insufficient documentation

## 2017-11-04 DIAGNOSIS — I10 Essential (primary) hypertension: Secondary | ICD-10-CM | POA: Diagnosis not present

## 2017-11-04 DIAGNOSIS — R519 Headache, unspecified: Secondary | ICD-10-CM

## 2017-11-04 DIAGNOSIS — Z041 Encounter for examination and observation following transport accident: Secondary | ICD-10-CM | POA: Diagnosis present

## 2017-11-04 MED ORDER — ORPHENADRINE CITRATE ER 100 MG PO TB12
100.0000 mg | ORAL_TABLET | Freq: Once | ORAL | Status: AC
  Administered 2017-11-04: 100 mg via ORAL
  Filled 2017-11-04: qty 1

## 2017-11-04 MED ORDER — ACETAMINOPHEN 500 MG PO TABS: 1000.0000 mg | ORAL_TABLET | Freq: Four times a day (QID) | ORAL | 0 refills | Status: AC | PRN

## 2017-11-04 MED ORDER — ACETAMINOPHEN 500 MG PO TABS
1000.0000 mg | ORAL_TABLET | Freq: Once | ORAL | Status: AC
  Administered 2017-11-04: 1000 mg via ORAL
  Filled 2017-11-04: qty 2

## 2017-11-04 MED ORDER — ORPHENADRINE CITRATE ER 100 MG PO TB12: 100.0000 mg | ORAL_TABLET | Freq: Two times a day (BID) | ORAL | 0 refills | Status: DC
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
## 2017-11-04 NOTE — ED Notes (Signed)
SPLINT TO LUE INTACT. ICE PACK GIVEN. WORK NOTE GIVEN

## 2017-11-04 NOTE — ED Notes (Signed)
RX X 2 GIVEN 

## 2017-11-04 NOTE — ED Notes (Signed)
ED Provider at bedside. PFEIFFER 

## 2017-11-04 NOTE — Progress Notes (Signed)
Orthopedic Tech Progress Note Patient Details:  Richard Lucas 05-14-1951 [id number]  Ortho Devices Type of Ortho Device: Velcro wrist splint       Saul FordyceJennifer C Omara Alcon 11/04/2017, 9:37 AM

## 2017-11-04 NOTE — ED Notes (Signed)
ICE PACK GIVEN

## 2017-11-04 NOTE — ED Notes (Signed)
ED Provider at bedside. PFEIFER 

## 2017-11-08 ENCOUNTER — Ambulatory Visit (INDEPENDENT_AMBULATORY_CARE_PROVIDER_SITE_OTHER): Payer: Medicare HMO | Admitting: Family

## 2017-11-08 ENCOUNTER — Encounter: Payer: Self-pay | Admitting: Family

## 2017-11-08 VITALS — BP 130/78 | HR 83 | Temp 98.1°F | Ht 67.0 in | Wt 165.0 lb

## 2017-11-08 DIAGNOSIS — M25532 Pain in left wrist: Secondary | ICD-10-CM
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
## 2018-06-24 ENCOUNTER — Encounter: Payer: Medicare HMO | Admitting: Internal Medicine

## 2018-06-30 ENCOUNTER — Other Ambulatory Visit (INDEPENDENT_AMBULATORY_CARE_PROVIDER_SITE_OTHER): Payer: Medicare HMO

## 2018-06-30 ENCOUNTER — Encounter: Payer: Self-pay | Admitting: Internal Medicine

## 2018-06-30 ENCOUNTER — Ambulatory Visit (INDEPENDENT_AMBULATORY_CARE_PROVIDER_SITE_OTHER): Payer: Medicare HMO | Admitting: Internal Medicine

## 2018-06-30 VITALS — BP 104/60 | HR 68 | Temp 98.1°F | Ht 67.0 in | Wt 165.0 lb

## 2018-06-30 DIAGNOSIS — Z1211 Encounter for screening for malignant neoplasm of colon: Secondary | ICD-10-CM

## 2018-06-30 DIAGNOSIS — N529 Male erectile dysfunction, unspecified: Secondary | ICD-10-CM | POA: Diagnosis not present

## 2018-06-30 DIAGNOSIS — Z0000 Encounter for general adult medical examination without abnormal findings: Secondary | ICD-10-CM | POA: Diagnosis not present

## 2018-06-30 DIAGNOSIS — I10 Essential (primary) hypertension: Secondary | ICD-10-CM | POA: Diagnosis not present

## 2018-06-30 LAB — LIPID PANEL
CHOL/HDL RATIO: 4
Cholesterol: 183 mg/dL (ref 0–200)
HDL: 41.4 mg/dL (ref >39.00)
LDL CALC: 109 mg/dL — AB (ref 0–99)
NONHDL: 141.17
Triglycerides: 162 mg/dL — ABNORMAL HIGH (ref 0.0–149.0)
VLDL: 32.4 mg/dL (ref 0.0–40.0)

## 2018-06-30 LAB — CBC
HCT: 42.7 % (ref 39.0–52.0)
HEMOGLOBIN: 14.3 g/dL (ref 13.0–17.0)
MCHC: 33.5 g/dL (ref 30.0–36.0)
MCV: 87.5 fl (ref 78.0–100.0)
Platelets: 152 10*3/uL (ref 150.0–400.0)
RBC: 4.88 Mil/uL (ref 4.22–5.81)
RDW: 14.5 % (ref 11.5–15.5)
WBC: 4.3 10*3/uL (ref 4.0–10.5)

## 2018-06-30 LAB — COMPREHENSIVE METABOLIC PANEL
ALT: 46 U/L (ref 0–53)
AST: 32 U/L (ref 0–37)
Albumin: 4.7 g/dL (ref 3.5–5.2)
Alkaline Phosphatase: 50 U/L (ref 39–117)
BILIRUBIN TOTAL: 1 mg/dL (ref 0.2–1.2)
BUN: 17 mg/dL (ref 6–23)
CALCIUM: 9.8 mg/dL (ref 8.4–10.5)
CHLORIDE: 101 meq/L (ref 96–112)
CO2: 33 meq/L — AB (ref 19–32)
CREATININE: 1.32 mg/dL (ref 0.40–1.50)
GFR: 69.04 mL/min (ref >60.00)
Glucose, Bld: 98 mg/dL (ref 70–99)
Potassium: 3.7 mEq/L (ref 3.5–5.1)
Sodium: 141 mEq/L (ref 135–145)
Total Protein: 7.7 g/dL (ref 6.0–8.3)

## 2018-06-30 NOTE — Patient Instructions (Signed)
We will have the colon cancer test to come in the mail to your house.    Health Maintenance, Male A healthy lifestyle and preventive care is important for your health and wellness. Ask your health care provider about what schedule of regular examinations is right for you. What should I know about weight and diet? Eat a Healthy Diet  Eat plenty of vegetables, fruits, whole grains, low-fat dairy products, and lean protein.  Do not eat a lot of foods high in solid fats, added sugars, or salt.  Maintain a Healthy Weight Regular exercise can help you achieve or maintain a healthy weight. You should:  Do at least 150 minutes of exercise each week. The exercise should increase your heart rate and make you sweat (moderate-intensity exercise).  Do strength-training exercises at least twice a week.  Watch Your Levels of Cholesterol and Blood Lipids  Have your blood tested for lipids and cholesterol every 5 years starting at 69 years of age. If you are at high risk for heart disease, you should start having your blood tested when you are 69 years old. You may need to have your cholesterol levels checked more often if: ? Your lipid or cholesterol levels are high. ? You are older than 69 years of age. ? You are at high risk for heart disease.  What should I know about cancer screening? Many types of cancers can be detected early and may often be prevented. Lung Cancer  You should be screened every year for lung cancer if: ? You are a current smoker who has smoked for at least 30 years. ? You are a former smoker who has quit within the past 15 years.  Talk to your health care provider about your screening options, when you should start screening, and how often you should be screened.  Colorectal Cancer  Routine colorectal cancer screening usually begins at 69 years of age and should be repeated every 5-10 years until you are 69 years old. You may need to be screened more often if early forms  of precancerous polyps or small growths are found. Your health care provider may recommend screening at an earlier age if you have risk factors for colon cancer.  Your health care provider may recommend using home test kits to check for hidden blood in the stool.  A small camera at the end of a tube can be used to examine your colon (sigmoidoscopy or colonoscopy). This checks for the earliest forms of colorectal cancer.  Prostate and Testicular Cancer  Depending on your age and overall health, your health care provider may do certain tests to screen for prostate and testicular cancer.  Talk to your health care provider about any symptoms or concerns you have about testicular or prostate cancer.  Skin Cancer  Check your skin from head to toe regularly.  Tell your health care provider about any new moles or changes in moles, especially if: ? There is a change in a mole's size, shape, or color. ? You have a mole that is larger than a pencil eraser.  Always use sunscreen. Apply sunscreen liberally and repeat throughout the day.  Protect yourself by wearing long sleeves, pants, a wide-brimmed hat, and sunglasses when outside.  What should I know about heart disease, diabetes, and high blood pressure?  If you are 63-71 years of age, have your blood pressure checked every 3-5 years. If you are 48 years of age or older, have your blood pressure checked every year. You  should have your blood pressure measured twice-once when you are at a hospital or clinic, and once when you are not at a hospital or clinic. Record the average of the two measurements. To check your blood pressure when you are not at a hospital or clinic, you can use: ? An automated blood pressure machine at a pharmacy. ? A home blood pressure monitor.  Talk to your health care provider about your target blood pressure.  If you are between 39-58 years old, ask your health care provider if you should take aspirin to prevent heart  disease.  Have regular diabetes screenings by checking your fasting blood sugar level. ? If you are at a normal weight and have a low risk for diabetes, have this test once every three years after the age of 38. ? If you are overweight and have a high risk for diabetes, consider being tested at a younger age or more often.  A one-time screening for abdominal aortic aneurysm (AAA) by ultrasound is recommended for men aged 40-75 years who are current or former smokers. What should I know about preventing infection? Hepatitis B If you have a higher risk for hepatitis B, you should be screened for this virus. Talk with your health care provider to find out if you are at risk for hepatitis B infection. Hepatitis C Blood testing is recommended for:  Everyone born from [birth year] through 1967.  Anyone with known risk factors for hepatitis C.  Sexually Transmitted Diseases (STDs)  You should be screened each year for STDs including gonorrhea and chlamydia if: ? You are sexually active and are younger than 69 years of age. ? You are older than 69 years of age and your health care provider tells you that you are at risk for this type of infection. ? Your sexual activity has changed since you were last screened and you are at an increased risk for chlamydia or gonorrhea. Ask your health care provider if you are at risk.  Talk with your health care provider about whether you are at high risk of being infected with HIV. Your health care provider may recommend a prescription medicine to help prevent HIV infection.  What else can I do?  Schedule regular health, dental, and eye exams.  Stay current with your vaccines (immunizations).  Do not use any tobacco products, such as cigarettes, chewing tobacco, and e-cigarettes. If you need help quitting, ask your health care provider.  Limit alcohol intake to no more than 2 drinks per day. One drink equals 12 ounces of beer, 5 ounces of wine, or 1 ounces of  hard liquor.  Do not use street drugs.  Do not share needles.  Ask your health care provider for help if you need support or information about quitting drugs.  Tell your health care provider if you often feel depressed.  Tell your health care provider if you have ever been abused or do not feel safe at home. This information is not intended to replace advice given to you by your health care provider. Make sure you discuss any questions you have with your health care provider. Document Released: 02/23/2008 Document Revised: 04/25/2016 Document Reviewed: 05/31/2015 Elsevier Interactive Patient Education  [employer].

## 2018-06-30 NOTE — Progress Notes (Signed)
   Subjective:    Patient ID: Richard Lucas, male    DOB: 09/17/1950, 67 y.o.   MRN: [id number]  HPI Here for medicare wellness and physical, no new complaints. Please see A/P for status and treatment of chronic medical problems.   Diet: heart healthy Physical activity: sedentary Depression/mood screen: negative Hearing: intact to whispered voice Visual acuity: grossly normal, performs annual eye exam  ADLs: capable Fall risk: none Home safety: good Cognitive evaluation: intact to orientation, naming, recall and repetition EOL planning: adv directives discussed  I have personally reviewed and have noted 1. The patient's medical and social history - reviewed today no changes 2. Their use of alcohol, tobacco or illicit drugs 3. Their current medications and supplements 4. The patient's functional ability including ADL's, fall risks, home safety risks and hearing or visual impairment. 5. Diet and physical activities 6. Evidence for depression or mood disorders 7. Care team reviewed and updated (available in snapshot)   Review of Systems  Constitutional: Negative.   HENT: Negative.   Eyes: Negative.   Respiratory: Negative for cough, chest tightness and shortness of breath.   Cardiovascular: Negative for chest pain, palpitations and leg swelling.  Gastrointestinal: Negative for abdominal distention, abdominal pain, constipation, diarrhea, nausea and vomiting.  Musculoskeletal: Negative.   Skin: Negative.   Neurological: Negative.   Psychiatric/Behavioral: Negative.       Objective:   Physical Exam  Constitutional: He is oriented to person, place, and time. He appears well-developed and well-nourished.  HENT:  Head: Normocephalic and atraumatic.  Eyes: EOM are normal.  Neck: Normal range of motion.  Cardiovascular: Normal rate and regular rhythm.  Pulmonary/Chest: Effort normal and breath sounds normal. No respiratory distress. He has no wheezes. He has no rales.    Abdominal: Soft. Bowel sounds are normal. He exhibits no distension. There is no tenderness. There is no rebound.  Musculoskeletal: He exhibits no edema.  Neurological: He is alert and oriented to person, place, and time. Coordination normal.  Skin: Skin is warm and dry.  Psychiatric: He has a normal mood and affect.   Vitals:   06/30/18 1606  BP: 104/60  Pulse: 68  Temp: 98.1 F (36.7 C)  TempSrc: Oral  SpO2: 98%  Weight: 165 lb (74.8 kg)  Height: 5\' 7"  (1.702 m)      Assessment & Plan:

## 2018-07-01 NOTE — Assessment & Plan Note (Signed)
Taking lisinopril/hctz and BP at goal. Checking CMP and adjust as needed.  

## 2018-07-01 NOTE — Assessment & Plan Note (Signed)
Flu shot declined. Pneumonia complete. Shingrix counseled. Tetanus up to date. Cologuard ordered. Counseled about sun safety and mole surveillance. Counseled about the dangers of distracted driving. Given 10 year screening recommendations.

## 2018-07-01 NOTE — Assessment & Plan Note (Signed)
Seeing urology

## 2018-07-11 DIAGNOSIS — N5201 Erectile dysfunction due to arterial insufficiency: Secondary | ICD-10-CM | POA: Diagnosis not present

## 2018-07-11 DIAGNOSIS — N486 Induration penis plastica: Secondary | ICD-10-CM | POA: Diagnosis not present

## 2018-07-11 DIAGNOSIS — R3915 Urgency of urination: Secondary | ICD-10-CM | POA: Diagnosis not present

## 2018-07-12 DIAGNOSIS — R69 Illness, unspecified: Secondary | ICD-10-CM | POA: Diagnosis not present

## 2018-08-20 DIAGNOSIS — H524 Presbyopia: Secondary | ICD-10-CM | POA: Diagnosis not present

## 2018-09-04 ENCOUNTER — Other Ambulatory Visit: Payer: Self-pay | Admitting: Internal Medicine

## 2018-09-04 DIAGNOSIS — I10 Essential (primary) hypertension: Secondary | ICD-10-CM

## 2019-05-27 DIAGNOSIS — R69 Illness, unspecified: Secondary | ICD-10-CM | POA: Diagnosis not present

## 2019-08-25 ENCOUNTER — Encounter: Payer: Self-pay | Admitting: Internal Medicine

## 2019-08-25 ENCOUNTER — Other Ambulatory Visit: Payer: Self-pay

## 2019-08-25 ENCOUNTER — Ambulatory Visit (INDEPENDENT_AMBULATORY_CARE_PROVIDER_SITE_OTHER): Payer: Medicare HMO | Admitting: Internal Medicine

## 2019-08-25 ENCOUNTER — Other Ambulatory Visit (INDEPENDENT_AMBULATORY_CARE_PROVIDER_SITE_OTHER): Payer: Medicare HMO

## 2019-08-25 VITALS — BP 136/80 | HR 73 | Temp 98.3°F | Ht 67.0 in | Wt 172.0 lb

## 2019-08-25 DIAGNOSIS — I10 Essential (primary) hypertension: Secondary | ICD-10-CM

## 2019-08-25 DIAGNOSIS — Z0000 Encounter for general adult medical examination without abnormal findings: Secondary | ICD-10-CM | POA: Diagnosis not present

## 2019-08-25 DIAGNOSIS — Z1211 Encounter for screening for malignant neoplasm of colon: Secondary | ICD-10-CM

## 2019-08-25 LAB — COMPREHENSIVE METABOLIC PANEL
ALT: 36 U/L (ref 0–53)
AST: 28 U/L (ref 0–37)
Albumin: 4.5 g/dL (ref 3.5–5.2)
Alkaline Phosphatase: 60 U/L (ref 39–117)
BUN: 11 mg/dL (ref 6–23)
CO2: 31 mEq/L (ref 19–32)
Calcium: 9.6 mg/dL (ref 8.4–10.5)
Chloride: 101 mEq/L (ref 96–112)
Creatinine, Ser: 1.06 mg/dL (ref 0.40–1.50)
GFR: 83.39 mL/min (ref >60.00)
Glucose, Bld: 135 mg/dL — ABNORMAL HIGH (ref 70–99)
Potassium: 3.7 mEq/L (ref 3.5–5.1)
Sodium: 141 mEq/L (ref 135–145)
Total Bilirubin: 0.8 mg/dL (ref 0.2–1.2)
Total Protein: 7.6 g/dL (ref 6.0–8.3)

## 2019-08-25 LAB — CBC
HCT: 41.4 % (ref 39.0–52.0)
Hemoglobin: 14 g/dL (ref 13.0–17.0)
MCHC: 33.7 g/dL (ref 30.0–36.0)
MCV: 86.5 fl (ref 78.0–100.0)
Platelets: 158 10*3/uL (ref 150.0–400.0)
RBC: 4.79 Mil/uL (ref 4.22–5.81)
RDW: 14.9 % (ref 11.5–15.5)
WBC: 5.8 10*3/uL (ref 4.0–10.5)

## 2019-08-25 LAB — LIPID PANEL
Cholesterol: 181 mg/dL (ref 0–200)
HDL: 43.3 mg/dL (ref >39.00)
NonHDL: 137.97
Total CHOL/HDL Ratio: 4
Triglycerides: 243 mg/dL — ABNORMAL HIGH (ref 0.0–149.0)
VLDL: 48.6 mg/dL — ABNORMAL HIGH (ref 0.0–40.0)

## 2019-08-25 LAB — LDL CHOLESTEROL, DIRECT: Direct LDL: 112 mg/dL

## 2019-08-25 LAB — HEMOGLOBIN A1C: Hgb A1c MFr Bld: 5.8 % (ref 4.6–6.5)

## 2019-08-25 NOTE — Progress Notes (Signed)
   Subjective:   Patient ID: Richard Lucas, male    DOB: 02-Nov-1950, 68 y.o.   MRN: [id number]  HPI Here for medicare wellness and physical, no new complaints. Please see A/P for status and treatment of chronic medical problems.   Diet: heart healthy  Physical activity: sedentary Depression/mood screen: negative Hearing: intact to whispered voice Visual acuity: grossly normal with lens, performs annual eye exam  ADLs: capable Fall risk: none Home safety: good Cognitive evaluation: intact to orientation, naming, recall and repetition EOL planning: adv directives discussed    Office Visit from 08/25/2019 in [facility]  PHQ-2 Total Score  0       I have personally reviewed and have noted 1. The patient's medical and social history - reviewed today no changes 2. Their use of alcohol, tobacco or illicit drugs 3. Their current medications and supplements 4. The patient's functional ability including ADL's, fall risks, home safety risks and hearing or visual impairment. 5. Diet and physical activities 6. Evidence for depression or mood disorders 7. Care team reviewed and updated  Patient Care Team: Hoyt Koch, MD as PCP - General (Internal Medicine) Past Medical History:  Diagnosis Date  . Hypertension    History reviewed. No pertinent surgical history. Family History  Problem Relation Age of Onset  . Hypertension Mother   . Hypertension Father   . Hypertension Sister   . Hypertension Brother     Review of Systems  Constitutional: Negative.   HENT: Negative.   Eyes: Negative.   Respiratory: Negative for cough, chest tightness and shortness of breath.   Cardiovascular: Negative for chest pain, palpitations and leg swelling.  Gastrointestinal: Negative for abdominal distention, abdominal pain, constipation, diarrhea, nausea and vomiting.  Musculoskeletal: Negative.   Skin: Negative.   Neurological: Negative.    Psychiatric/Behavioral: Negative.     Objective:  Physical Exam Constitutional:      Appearance: He is well-developed.  HENT:     Head: Normocephalic and atraumatic.  Cardiovascular:     Rate and Rhythm: Normal rate and regular rhythm.  Pulmonary:     Effort: Pulmonary effort is normal. No respiratory distress.     Breath sounds: Normal breath sounds. No wheezing or rales.  Abdominal:     General: Bowel sounds are normal. There is no distension.     Palpations: Abdomen is soft.     Tenderness: There is no abdominal tenderness. There is no rebound.  Musculoskeletal:     Cervical back: Normal range of motion.  Skin:    General: Skin is warm and dry.  Neurological:     Mental Status: He is alert and oriented to person, place, and time.     Coordination: Coordination normal.     Vitals:   08/25/19 1417  BP: 136/80  Pulse: 73  Temp: 98.3 F (36.8 C)  TempSrc: Oral  SpO2: 97%  Weight: 172 lb (78 kg)  Height: 5\' 7"  (1.702 m)   EKG: Rate 65, axis normal, interval normal, no st or t wave changes, no significant change from 2014  This visit occurred during the SARS-CoV-2 public health emergency.  Safety protocols were in place, including screening questions prior to the visit, additional usage of staff PPE, and extensive cleaning of exam room while observing appropriate contact time as indicated for disinfecting solutions.   Assessment & Plan:

## 2019-08-25 NOTE — Assessment & Plan Note (Signed)
Flu shot up to date. Pneumonia complete. Shingrix counseled. Tetanus due 2026. Cologuard ordered. Counseled about sun safety and mole surveillance. Counseled about the dangers of distracted driving. Given 10 year screening recommendations.

## 2019-08-25 NOTE — Patient Instructions (Addendum)
Shoulder Exercises Ask your health care provider which exercises are safe for you. Do exercises exactly as told by your health care provider and adjust them as directed. It is normal to feel mild stretching, pulling, tightness, or discomfort as you do these exercises. Stop right away if you feel sudden pain or your pain gets worse. Do not begin these exercises until told by your health care provider. Stretching exercises External rotation and abduction This exercise is sometimes called corner stretch. This exercise rotates your arm outward (external rotation) and moves your arm out from your body (abduction). 1. Stand in a doorway with one of your feet slightly in front of the other. This is called a staggered stance. If you cannot reach your forearms to the door frame, stand facing a corner of a room. 2. Choose one of the following positions as told by your health care provider: ? Place your hands and forearms on the door frame above your head. ? Place your hands and forearms on the door frame at the height of your head. ? Place your hands on the door frame at the height of your elbows. 3. Slowly move your weight onto your front foot until you feel a stretch across your chest and in the front of your shoulders. Keep your head and chest upright and keep your abdominal muscles tight. 4. Hold for __________ seconds. 5. To release the stretch, shift your weight to your back foot. Repeat __________ times. Complete this exercise __________ times a day. Extension, standing 1. Stand and hold a broomstick, a cane, or a similar object behind your back. ? Your hands should be a little wider than shoulder width apart. ? Your palms should face away from your back. 2. Keeping your elbows straight and your shoulder muscles relaxed, move the stick away from your body until you feel a stretch in your shoulders (extension). ? Avoid shrugging your shoulders while you move the stick. Keep your shoulder blades tucked  down toward the middle of your back. 3. Hold for __________ seconds. 4. Slowly return to the starting position. Repeat __________ times. Complete this exercise __________ times a day. Range-of-motion exercises Pendulum  1. Stand near a wall or a surface that you can hold onto for balance. 2. Bend at the waist and let your left / right arm hang straight down. Use your other arm to support you. Keep your back straight and do not lock your knees. 3. Relax your left / right arm and shoulder muscles, and move your hips and your trunk so your left / right arm swings freely. Your arm should swing because of the motion of your body, not because you are using your arm or shoulder muscles. 4. Keep moving your hips and trunk so your arm swings in the following directions, as told by your health care provider: ? Side to side. ? Forward and backward. ? In clockwise and counterclockwise circles. 5. Continue each motion for __________ seconds, or for as long as told by your health care provider. 6. Slowly return to the starting position. Repeat __________ times. Complete this exercise __________ times a day. Shoulder flexion, standing  1. Stand and hold a broomstick, a cane, or a similar object. Place your hands a little more than shoulder width apart on the object. Your left / right hand should be palm up, and your other hand should be palm down. 2. Keep your elbow straight and your shoulder muscles relaxed. Push the stick up with your healthy arm to   raise your left / right arm in front of your body, and then over your head until you feel a stretch in your shoulder (flexion). ? Avoid shrugging your shoulder while you raise your arm. Keep your shoulder blade tucked down toward the middle of your back. 3. Hold for __________ seconds. 4. Slowly return to the starting position. Repeat __________ times. Complete this exercise __________ times a day. Shoulder abduction, standing 1. Stand and hold a broomstick,  a cane, or a similar object. Place your hands a little more than shoulder width apart on the object. Your left / right hand should be palm up, and your other hand should be palm down. 2. Keep your elbow straight and your shoulder muscles relaxed. Push the object across your body toward your left / right side. Raise your left / right arm to the side of your body (abduction) until you feel a stretch in your shoulder. ? Do not raise your arm above shoulder height unless your health care provider tells you to do that. ? If directed, raise your arm over your head. ? Avoid shrugging your shoulder while you raise your arm. Keep your shoulder blade tucked down toward the middle of your back. 3. Hold for __________ seconds. 4. Slowly return to the starting position. Repeat __________ times. Complete this exercise __________ times a day. Internal rotation  1. Place your left / right hand behind your back, palm up. 2. Use your other hand to dangle an exercise band, a towel, or a similar object over your shoulder. Grasp the band with your left / right hand so you are holding on to both ends. 3. Gently pull up on the band until you feel a stretch in the front of your left / right shoulder. The movement of your arm toward the center of your body is called internal rotation. ? Avoid shrugging your shoulder while you raise your arm. Keep your shoulder blade tucked down toward the middle of your back. 4. Hold for __________ seconds. 5. Release the stretch by letting go of the band and lowering your hands. Repeat __________ times. Complete this exercise __________ times a day. Strengthening exercises External rotation  1. Sit in a stable chair without armrests. 2. Secure an exercise band to a stable object at elbow height on your left / right side. 3. Place a soft object, such as a folded towel or a small pillow, between your left / right upper arm and your body to move your elbow about 4 inches (10 cm) away  from your side. 4. Hold the end of the exercise band so it is tight and there is no slack. 5. Keeping your elbow pressed against the soft object, slowly move your forearm out, away from your abdomen (external rotation). Keep your body steady so only your forearm moves. 6. Hold for __________ seconds. 7. Slowly return to the starting position. Repeat __________ times. Complete this exercise __________ times a day. Shoulder abduction  1. Sit in a stable chair without armrests, or stand up. 2. Hold a __________ weight in your left / right hand, or hold an exercise band with both hands. 3. Start with your arms straight down and your left / right palm facing in, toward your body. 4. Slowly lift your left / right hand out to your side (abduction). Do not lift your hand above shoulder height unless your health care provider tells you that this is safe. ? Keep your arms straight. ? Avoid shrugging your shoulder while you   do this movement. Keep your shoulder blade tucked down toward the middle of your back. 5. Hold for __________ seconds. 6. Slowly lower your arm, and return to the starting position. Repeat __________ times. Complete this exercise __________ times a day. Shoulder extension 1. Sit in a stable chair without armrests, or stand up. 2. Secure an exercise band to a stable object in front of you so it is at shoulder height. 3. Hold one end of the exercise band in each hand. Your palms should face each other. 4. Straighten your elbows and lift your hands up to shoulder height. 5. Step back, away from the secured end of the exercise band, until the band is tight and there is no slack. 6. Squeeze your shoulder blades together as you pull your hands down to the sides of your thighs (extension). Stop when your hands are straight down by your sides. Do not let your hands go behind your body. 7. Hold for __________ seconds. 8. Slowly return to the starting position. Repeat __________ times.  Complete this exercise __________ times a day. Shoulder row 1. Sit in a stable chair without armrests, or stand up. 2. Secure an exercise band to a stable object in front of you so it is at waist height. 3. Hold one end of the exercise band in each hand. Position your palms so that your thumbs are facing the ceiling (neutral position). 4. Bend each of your elbows to a 90-degree angle (right angle) and keep your upper arms at your sides. 5. Step back until the band is tight and there is no slack. 6. Slowly pull your elbows back behind you. 7. Hold for __________ seconds. 8. Slowly return to the starting position. Repeat __________ times. Complete this exercise __________ times a day. Shoulder press-ups  1. Sit in a stable chair that has armrests. Sit upright, with your feet flat on the floor. 2. Put your hands on the armrests so your elbows are bent and your fingers are pointing forward. Your hands should be about even with the sides of your body. 3. Push down on the armrests and use your arms to lift yourself off the chair. Straighten your elbows and lift yourself up as much as you comfortably can. ? Move your shoulder blades down, and avoid letting your shoulders move up toward your ears. ? Keep your feet on the ground. As you get stronger, your feet should support less of your body weight as you lift yourself up. 4. Hold for __________ seconds. 5. Slowly lower yourself back into the chair. Repeat __________ times. Complete this exercise __________ times a day. Wall push-ups  1. Stand so you are facing a stable wall. Your feet should be about one arm-length away from the wall. 2. Lean forward and place your palms on the wall at shoulder height. 3. Keep your feet flat on the floor as you bend your elbows and lean forward toward the wall. 4. Hold for __________ seconds. 5. Straighten your elbows to push yourself back to the starting position. Repeat __________ times. Complete this exercise  __________ times a day. This information is not intended to replace advice given to you by your health care provider. Make sure you discuss any questions you have with your health care provider. Document Released: 07/11/2005 Document Revised: 12/19/2018 Document Reviewed: 09/26/2018 Elsevier Patient Education  2020 [place] Maintenance, Male Adopting a healthy lifestyle and getting preventive care are important in promoting health and wellness. Ask your health care  provider about:  The right schedule for you to have regular tests and exams.  Things you can do on your own to prevent diseases and keep yourself healthy. What should I know about diet, weight, and exercise? Eat a healthy diet   Eat a diet that includes plenty of vegetables, fruits, low-fat dairy products, and lean protein.  Do not eat a lot of foods that are high in solid fats, added sugars, or sodium. Maintain a healthy weight Body mass index (BMI) is a measurement that can be used to identify possible weight problems. It estimates body fat based on height and weight. Your health care provider can help determine your BMI and help you achieve or maintain a healthy weight. Get regular exercise Get regular exercise. This is one of the most important things you can do for your health. Most adults should:  Exercise for at least 150 minutes each week. The exercise should increase your heart rate and make you sweat (moderate-intensity exercise).  Do strengthening exercises at least twice a week. This is in addition to the moderate-intensity exercise.  Spend less time sitting. Even light physical activity can be beneficial. Watch cholesterol and blood lipids Have your blood tested for lipids and cholesterol at 70 years of age, then have this test every 5 years. You may need to have your cholesterol levels checked more often if:  Your lipid or cholesterol levels are high.  You are older than 70 years of  age.  You are at high risk for heart disease. What should I know about cancer screening? Many types of cancers can be detected early and may often be prevented. Depending on your health history and family history, you may need to have cancer screening at various ages. This may include screening for:  Colorectal cancer.  Prostate cancer.  Skin cancer.  Lung cancer. What should I know about heart disease, diabetes, and high blood pressure? Blood pressure and heart disease  High blood pressure causes heart disease and increases the risk of stroke. This is more likely to develop in people who have high blood pressure readings, are of African descent, or are overweight.  Talk with your health care provider about your target blood pressure readings.  Have your blood pressure checked: ? Every 3-5 years if you are 2518-539 years of age. ? Every year if you are 70 years old or older.  If you are between the ages of 4465 and 2175 and are a current or former smoker, ask your health care provider if you should have a one-time screening for abdominal aortic aneurysm (AAA). Diabetes Have regular diabetes screenings. This checks your fasting blood sugar level. Have the screening done:  Once every three years after age 70 if you are at a normal weight and have a low risk for diabetes.  More often and at a younger age if you are overweight or have a high risk for diabetes. What should I know about preventing infection? Hepatitis B If you have a higher risk for hepatitis B, you should be screened for this virus. Talk with your health care provider to find out if you are at risk for hepatitis B infection. Hepatitis C Blood testing is recommended for:  Everyone born from [birth year]1945 through 1965.  Anyone with known risk factors for hepatitis C. Sexually transmitted infections (STIs)  You should be screened each year for STIs, including gonorrhea and chlamydia, if: ? You are sexually active and are younger  than 70 years of age. ?  You are older than 70 years of age and your health care provider tells you that you are at risk for this type of infection. ? Your sexual activity has changed since you were last screened, and you are at increased risk for chlamydia or gonorrhea. Ask your health care provider if you are at risk.  Ask your health care provider about whether you are at high risk for HIV. Your health care provider may recommend a prescription medicine to help prevent HIV infection. If you choose to take medicine to prevent HIV, you should first get tested for HIV. You should then be tested every 3 months for as long as you are taking the medicine. Follow these instructions at home: Lifestyle  Do not use any products that contain nicotine or tobacco, such as cigarettes, e-cigarettes, and chewing tobacco. If you need help quitting, ask your health care provider.  Do not use street drugs.  Do not share needles.  Ask your health care provider for help if you need support or information about quitting drugs. Alcohol use  Do not drink alcohol if your health care provider tells you not to drink.  If you drink alcohol: ? Limit how much you have to 0-2 drinks a day. ? Be aware of how much alcohol is in your drink. In the [place] one drink equals one 12 oz bottle of beer (355 mL), one 5 oz glass of wine (148 mL), or one 1 oz glass of hard liquor (44 mL). General instructions  Schedule regular health, dental, and eye exams.  Stay current with your vaccines.  Tell your health care provider if: ? You often feel depressed. ? You have ever been abused or do not feel safe at home. Summary  Adopting a healthy lifestyle and getting preventive care are important in promoting health and wellness.  Follow your health care provider's instructions about healthy diet, exercising, and getting tested or screened for diseases.  Follow your health care provider's instructions on monitoring your  cholesterol and blood pressure. This information is not intended to replace advice given to you by your health care provider. Make sure you discuss any questions you have with your health care provider. Document Released: 02/23/2008 Document Revised: 08/20/2018 Document Reviewed: 08/20/2018 Elsevier Patient Education  2020 [employer].

## 2019-08-25 NOTE — Assessment & Plan Note (Signed)
BP at goal, EKG done to update baseline which is unchanged from 2014. Checking CMP and adjust lisinopril/hctz 20/25 mg daily as needed.

## 2019-09-25 DIAGNOSIS — N486 Induration penis plastica: Secondary | ICD-10-CM | POA: Diagnosis not present

## 2019-09-25 DIAGNOSIS — N5201 Erectile dysfunction due to arterial insufficiency: Secondary | ICD-10-CM | POA: Diagnosis not present

## 2019-09-25 DIAGNOSIS — R3915 Urgency of urination: Secondary | ICD-10-CM | POA: Diagnosis not present

## 2019-10-16 DIAGNOSIS — Z823 Family history of stroke: Secondary | ICD-10-CM | POA: Diagnosis not present

## 2019-10-16 DIAGNOSIS — Z9981 Dependence on supplemental oxygen: Secondary | ICD-10-CM | POA: Diagnosis not present

## 2019-10-16 DIAGNOSIS — I10 Essential (primary) hypertension: Secondary | ICD-10-CM | POA: Diagnosis not present

## 2019-10-16 DIAGNOSIS — N529 Male erectile dysfunction, unspecified: Secondary | ICD-10-CM | POA: Diagnosis not present

## 2019-10-16 DIAGNOSIS — Z8249 Family history of ischemic heart disease and other diseases of the circulatory system: Secondary | ICD-10-CM | POA: Diagnosis not present

## 2019-10-16 DIAGNOSIS — Z008 Encounter for other general examination: Secondary | ICD-10-CM | POA: Diagnosis not present

## 2019-10-17 ENCOUNTER — Other Ambulatory Visit: Payer: Self-pay | Admitting: Internal Medicine

## 2019-10-17 DIAGNOSIS — I10 Essential (primary) hypertension: Secondary | ICD-10-CM

## 2020-02-16 ENCOUNTER — Other Ambulatory Visit: Payer: Self-pay

## 2020-02-16 ENCOUNTER — Ambulatory Visit (INDEPENDENT_AMBULATORY_CARE_PROVIDER_SITE_OTHER): Payer: Medicare HMO | Admitting: Internal Medicine

## 2020-02-16 ENCOUNTER — Encounter: Payer: Self-pay | Admitting: Internal Medicine

## 2020-02-16 VITALS — BP 126/76 | HR 94 | Temp 98.3°F | Ht 67.0 in | Wt 170.0 lb

## 2020-02-16 DIAGNOSIS — M7989 Other specified soft tissue disorders: Secondary | ICD-10-CM

## 2020-02-16 LAB — COMPREHENSIVE METABOLIC PANEL
ALT: 47 U/L (ref 0–53)
AST: 31 U/L (ref 0–37)
Albumin: 4.2 g/dL (ref 3.5–5.2)
Alkaline Phosphatase: 55 U/L (ref 39–117)
BUN: 12 mg/dL (ref 6–23)
CO2: 31 mEq/L (ref 19–32)
Calcium: 9.7 mg/dL (ref 8.4–10.5)
Chloride: 99 mEq/L (ref 96–112)
Creatinine, Ser: 1.1 mg/dL (ref 0.40–1.50)
GFR: 79.79 mL/min (ref >60.00)
Glucose, Bld: 256 mg/dL — ABNORMAL HIGH (ref 70–99)
Potassium: 3.6 mEq/L (ref 3.5–5.1)
Sodium: 137 mEq/L (ref 135–145)
Total Bilirubin: 0.7 mg/dL (ref 0.2–1.2)
Total Protein: 7.1 g/dL (ref 6.0–8.3)

## 2020-02-16 LAB — CBC
HCT: 36.7 % — ABNORMAL LOW (ref 39.0–52.0)
Hemoglobin: 12.7 g/dL — ABNORMAL LOW (ref 13.0–17.0)
MCHC: 34.7 g/dL (ref 30.0–36.0)
MCV: 85.4 fl (ref 78.0–100.0)
Platelets: 136 10*3/uL — ABNORMAL LOW (ref 150.0–400.0)
RBC: 4.29 Mil/uL (ref 4.22–5.81)
RDW: 14.6 % (ref 11.5–15.5)
WBC: 5.6 10*3/uL (ref 4.0–10.5)

## 2020-02-16 LAB — BRAIN NATRIURETIC PEPTIDE: Pro B Natriuretic peptide (BNP): 26 pg/mL (ref 0.0–100.0)

## 2020-02-16 LAB — CK: Total CK: 303 U/L — ABNORMAL HIGH (ref 7–232)

## 2020-02-16 MED ORDER — MELOXICAM 15 MG PO TABS: 15.0000 mg | ORAL_TABLET | Freq: Every day | ORAL | 0 refills | Status: DC

## 2020-02-16 NOTE — Patient Instructions (Signed)
We have sent in meloxicam for pain that you can take 1 pill daily.   We are checking the labs for the cause.

## 2020-02-16 NOTE — Progress Notes (Signed)
   Subjective:   Patient ID: Richard Lucas, male    DOB: 05-31-1951, 69 y.o.   MRN: [id number]  HPI The patient is a 69 YO man coming in for concerns about leg pain. Noticed some rash on the skin with darkening around the site of old scars on both legs. There is some swelling as well in the area. Denies injury to the area or change in diet. Denies change in medication. Denies trying anything for this. Overall not worsening and started several weeks ago. Denies calf pain, the pain is more frontal in area of the swelling/color change.   Review of Systems  Constitutional: Negative.   HENT: Negative.   Eyes: Negative.   Respiratory: Negative for cough, chest tightness and shortness of breath.   Cardiovascular: Positive for leg swelling. Negative for chest pain and palpitations.  Gastrointestinal: Negative for abdominal distention, abdominal pain, constipation, diarrhea, nausea and vomiting.  Musculoskeletal: Positive for myalgias.  Skin: Positive for rash.  Neurological: Negative.   Psychiatric/Behavioral: Negative.     Objective:  Physical Exam Constitutional:      Appearance: He is well-developed.  HENT:     Head: Normocephalic and atraumatic.  Cardiovascular:     Rate and Rhythm: Normal rate and regular rhythm.  Pulmonary:     Effort: Pulmonary effort is normal. No respiratory distress.     Breath sounds: Normal breath sounds. No wheezing or rales.  Abdominal:     General: Bowel sounds are normal. There is no distension.     Palpations: Abdomen is soft.     Tenderness: There is no abdominal tenderness. There is no rebound.  Musculoskeletal:        General: Tenderness present.     Cervical back: Normal range of motion.  Skin:    General: Skin is warm and dry.     Findings: Rash present.     Comments: Scarring both legs frontal shin region. Right more than left. With some swelling in the area on both legs with some darkening of the skin. Not typical for cellulitis but could  represent early cellulitis.   Neurological:     Mental Status: He is alert and oriented to person, place, and time.     Coordination: Coordination normal.    Vitals:   02/16/20 1513  BP: 126/76  Pulse: 94  Temp: 98.3 F (36.8 C)  TempSrc: Oral  SpO2: 96%  Weight: 170 lb (77.1 kg)  Height: 5\' 7"  (1.702 m)   This visit occurred during the SARS-CoV-2 public health emergency.  Safety protocols were in place, including screening questions prior to the visit, additional usage of staff PPE, and extensive cleaning of exam room while observing appropriate contact time as indicated for disinfecting solutions.   Assessment & Plan:

## 2020-02-17 ENCOUNTER — Encounter: Payer: Self-pay | Admitting: Internal Medicine

## 2020-02-17 DIAGNOSIS — M7989 Other specified soft tissue disorders: Secondary | ICD-10-CM | POA: Insufficient documentation

## 2020-02-17 NOTE — Assessment & Plan Note (Signed)
Could be related to a cellulitis although this is not clear. He prefers to wait for labs to assess and treat. Rx meloxicam for pain. Checking CBC, CMP, BNP, CK. No signs of DVT on exam as swelling is not in calf and no calf tenderness.

## 2020-02-22 ENCOUNTER — Other Ambulatory Visit: Payer: Self-pay | Admitting: Internal Medicine

## 2020-02-22 MED ORDER — SULFAMETHOXAZOLE-TRIMETHOPRIM 800-160 MG PO TABS: 1.0000 | ORAL_TABLET | Freq: Two times a day (BID) | ORAL | 0 refills | Status: DC

## 2020-03-10 ENCOUNTER — Other Ambulatory Visit: Payer: Self-pay

## 2020-03-10 ENCOUNTER — Encounter: Payer: Self-pay | Admitting: Internal Medicine

## 2020-03-10 ENCOUNTER — Ambulatory Visit (INDEPENDENT_AMBULATORY_CARE_PROVIDER_SITE_OTHER): Payer: Medicare HMO | Admitting: Internal Medicine

## 2020-03-10 VITALS — BP 116/72 | HR 76 | Temp 98.4°F | Ht 67.0 in | Wt 171.0 lb

## 2020-03-10 DIAGNOSIS — M7989 Other specified soft tissue disorders: Secondary | ICD-10-CM | POA: Diagnosis not present

## 2020-03-10 DIAGNOSIS — R21 Rash and other nonspecific skin eruption: Secondary | ICD-10-CM

## 2020-03-10 MED ORDER — FUROSEMIDE 20 MG PO TABS: 20.0000 mg | ORAL_TABLET | Freq: Every day | ORAL | 0 refills | Status: DC

## 2020-03-10 NOTE — Patient Instructions (Signed)
We have sent in lasix (furosmide) to take 1 pill daily for the next 1-2 weeks to get the swelling out of the legs.  We will get you in with a dermatologist to see if we can prevent this happening again.

## 2020-03-10 NOTE — Assessment & Plan Note (Signed)
Rx lasix 20 mg daily for 1-2 weeks to try for the swelling. Referral to dermatology per patient request to evaluate. Color change is improving and likely represents chronic change at this time.

## 2020-03-10 NOTE — Progress Notes (Signed)
   Subjective:   Patient ID: Richard Lucas, male    DOB: June 26, 1951, 69 y.o.   MRN: [id number]  HPI The patient is a 69 YO man coming in for leg swelling/pain. Seen about 1 month ago for same and given meloxicam to try for the pain as well as course of bactrim for possible cellulitis. Labs without etiology for the swelling. The swelling is located at area of old scarring. Since taking those medication he is not having the pain anymore. Still with color change with darkening of the skin which is improving. No open sores anymore. Pain is almost gone. Swelling is still present but improving. Left leg almost normal, right still some swollen.   Review of Systems  Constitutional: Negative.   HENT: Negative.   Eyes: Negative.   Respiratory: Negative for cough, chest tightness and shortness of breath.   Cardiovascular: Positive for leg swelling. Negative for chest pain and palpitations.  Gastrointestinal: Negative for abdominal distention, abdominal pain, constipation, diarrhea, nausea and vomiting.  Musculoskeletal: Negative.   Skin: Positive for color change and rash.  Neurological: Negative.   Psychiatric/Behavioral: Negative.     Objective:  Physical Exam Constitutional:      Appearance: He is well-developed.  HENT:     Head: Normocephalic and atraumatic.  Cardiovascular:     Rate and Rhythm: Normal rate and regular rhythm.  Pulmonary:     Effort: Pulmonary effort is normal. No respiratory distress.     Breath sounds: Normal breath sounds. No wheezing or rales.  Abdominal:     General: Bowel sounds are normal. There is no distension.     Palpations: Abdomen is soft.     Tenderness: There is no abdominal tenderness. There is no rebound.  Musculoskeletal:     Cervical back: Normal range of motion.     Comments: Some mild edema around the scars on the left and right leg shin area. No pain to palpation and no redness. Some darkening of skin around scar tissue  Skin:    General: Skin is  warm and dry.  Neurological:     Mental Status: He is alert and oriented to person, place, and time.     Coordination: Coordination normal.     Vitals:   03/10/20 1058  BP: 116/72  Pulse: 76  Temp: 98.4 F (36.9 C)  TempSrc: Oral  SpO2: 97%  Weight: 171 lb (77.6 kg)  Height: 5\' 7"  (1.702 m)    This visit occurred during the SARS-CoV-2 public health emergency.  Safety protocols were in place, including screening questions prior to the visit, additional usage of staff PPE, and extensive cleaning of exam room while observing appropriate contact time as indicated for disinfecting solutions.   Assessment & Plan:

## 2020-03-22 ENCOUNTER — Telehealth: Payer: Self-pay | Admitting: Dermatology

## 2020-03-22 NOTE — Telephone Encounter (Signed)
Referral from? Told him nothing until January, but he said he can't wait that long

## 2020-03-24 ENCOUNTER — Telehealth: Payer: Self-pay

## 2020-03-24 NOTE — Telephone Encounter (Signed)
New message   Per Patient  The cardiologist referral appt will be in Jan asking for the referral to be the route to another Cardiologist's office.

## 2020-03-28 NOTE — Telephone Encounter (Signed)
Dermatology referral was sent to [facility].

## 2020-04-05 ENCOUNTER — Encounter: Payer: Self-pay | Admitting: Internal Medicine

## 2020-04-05 NOTE — Telephone Encounter (Signed)
Pt never returned [employer] calls to schedule - mailed pt ltr to contact them

## 2020-04-14 DIAGNOSIS — L218 Other seborrheic dermatitis: Secondary | ICD-10-CM | POA: Diagnosis not present

## 2020-04-14 DIAGNOSIS — I8310 Varicose veins of unspecified lower extremity with inflammation: Secondary | ICD-10-CM | POA: Diagnosis not present

## 2020-04-15 ENCOUNTER — Other Ambulatory Visit: Payer: Self-pay | Admitting: Internal Medicine

## 2020-04-18 ENCOUNTER — Other Ambulatory Visit: Payer: Self-pay | Admitting: Internal Medicine

## 2020-04-28 DIAGNOSIS — I8310 Varicose veins of unspecified lower extremity with inflammation: Secondary | ICD-10-CM | POA: Diagnosis not present

## 2020-04-28 DIAGNOSIS — R600 Localized edema: Secondary | ICD-10-CM | POA: Diagnosis not present

## 2020-06-29 DIAGNOSIS — R600 Localized edema: Secondary | ICD-10-CM | POA: Diagnosis not present

## 2020-06-29 DIAGNOSIS — I8310 Varicose veins of unspecified lower extremity with inflammation: Secondary | ICD-10-CM | POA: Diagnosis not present

## 2020-06-29 DIAGNOSIS — L218 Other seborrheic dermatitis: Secondary | ICD-10-CM | POA: Diagnosis not present

## 2020-06-29 DIAGNOSIS — L810 Postinflammatory hyperpigmentation: Secondary | ICD-10-CM | POA: Diagnosis not present

## 2020-07-04 ENCOUNTER — Ambulatory Visit (INDEPENDENT_AMBULATORY_CARE_PROVIDER_SITE_OTHER): Payer: Medicare HMO | Admitting: Internal Medicine

## 2020-07-04 ENCOUNTER — Encounter: Payer: Self-pay | Admitting: Internal Medicine

## 2020-07-04 ENCOUNTER — Other Ambulatory Visit: Payer: Medicare HMO

## 2020-07-04 ENCOUNTER — Other Ambulatory Visit: Payer: Self-pay

## 2020-07-04 VITALS — BP 152/72 | HR 94 | Temp 98.8°F | Ht 67.0 in | Wt 153.0 lb

## 2020-07-04 DIAGNOSIS — R739 Hyperglycemia, unspecified: Secondary | ICD-10-CM | POA: Diagnosis not present

## 2020-07-04 DIAGNOSIS — Z23 Encounter for immunization: Secondary | ICD-10-CM | POA: Diagnosis not present

## 2020-07-04 DIAGNOSIS — R55 Syncope and collapse: Secondary | ICD-10-CM | POA: Diagnosis not present

## 2020-07-04 DIAGNOSIS — I10 Essential (primary) hypertension: Secondary | ICD-10-CM | POA: Diagnosis not present

## 2020-07-04 LAB — COMPREHENSIVE METABOLIC PANEL
ALT: 33 U/L (ref 0–53)
AST: 26 U/L (ref 0–37)
Albumin: 4.7 g/dL (ref 3.5–5.2)
Alkaline Phosphatase: 80 U/L (ref 39–117)
BUN: 17 mg/dL (ref 6–23)
CO2: 31 mEq/L (ref 19–32)
Calcium: 10.6 mg/dL — ABNORMAL HIGH (ref 8.4–10.5)
Chloride: 89 mEq/L — ABNORMAL LOW (ref 96–112)
Creatinine, Ser: 1.48 mg/dL (ref 0.40–1.50)
GFR: 47.27 mL/min — ABNORMAL LOW (ref >60.00)
Glucose, Bld: 462 mg/dL — ABNORMAL HIGH (ref 70–99)
Potassium: 3.5 mEq/L (ref 3.5–5.1)
Sodium: 131 mEq/L — ABNORMAL LOW (ref 135–145)
Total Bilirubin: 1 mg/dL (ref 0.2–1.2)
Total Protein: 8.2 g/dL (ref 6.0–8.3)

## 2020-07-04 LAB — CBC WITH DIFFERENTIAL/PLATELET
Basophils Absolute: 0 10*3/uL (ref 0.0–0.1)
Basophils Relative: 0.6 % (ref 0.0–3.0)
Eosinophils Absolute: 0.1 10*3/uL (ref 0.0–0.7)
Eosinophils Relative: 1.4 % (ref 0.0–5.0)
HCT: 44.7 % (ref 39.0–52.0)
Hemoglobin: 15 g/dL (ref 13.0–17.0)
Lymphocytes Relative: 25.3 % (ref 12.0–46.0)
Lymphs Abs: 1.7 10*3/uL (ref 0.7–4.0)
MCHC: 33.7 g/dL (ref 30.0–36.0)
MCV: 82.8 fl (ref 78.0–100.0)
Monocytes Absolute: 0.4 10*3/uL (ref 0.1–1.0)
Monocytes Relative: 5.4 % (ref 3.0–12.0)
Neutro Abs: 4.5 10*3/uL (ref 1.4–7.7)
Neutrophils Relative %: 67.3 % (ref 43.0–77.0)
Platelets: 138 10*3/uL — ABNORMAL LOW (ref 150.0–400.0)
RBC: 5.4 Mil/uL (ref 4.22–5.81)
RDW: 14.3 % (ref 11.5–15.5)
WBC: 6.6 10*3/uL (ref 4.0–10.5)

## 2020-07-04 MED ORDER — LISINOPRIL-HYDROCHLOROTHIAZIDE 20-25 MG PO TABS: 1.0000 | ORAL_TABLET | Freq: Every day | ORAL | 0 refills | Status: DC

## 2020-07-04 MED ORDER — NEBIVOLOL HCL 2.5 MG PO TABS: 2.5000 mg | ORAL_TABLET | Freq: Every day | ORAL | 5 refills | Status: DC

## 2020-07-04 NOTE — Assessment & Plan Note (Addendum)
Acute  had some hypoglycemia in the past and has had some blurry vision Admits to increased urination and thirst Check A1c

## 2020-07-04 NOTE — Progress Notes (Signed)
Subjective:    Patient ID: Richard Lucas, male    DOB: [birth year], 69 y.o.   MRN: [id number]  HPI The patient is here for an acute visit.   He fell last Thursday.  He was driving home from work ( works at night) the yellow and [employer] were crossing.  He pulled over for short period of time and then was able to drive home.Marland Kitchen  He has had some blurry vision on occasion.  He is not up-to-date with his eye exams.  Friday he was home and felt ok - he was in the bathroom and urinated and then fainted.  He denies any lightheadedness/dizziness at that time.  He denies chest pain or palpitations.  He was surprised that he was on the floor, but denies confusion.  He did hit the back of his head which is a little sore.  He was not checking his blood pressure on a regular basis prior to that, but did start afterwards.  His BP after he fell was 150's/70's.  Since then he has been checking it regularly.  Yesterday morning it was high.  It has been in the 150s-160s/70s.       Medications and allergies reviewed with patient and updated if appropriate.  Patient Active Problem List   Diagnosis Date Noted  . Leg swelling 02/17/2020  . Routine general medical examination at a health care facility 06/18/2017  . Erectile dysfunction 12/19/2015  . Essential hypertension 07/26/2014    Current Outpatient Medications on File Prior to Visit  Medication Sig Dispense Refill  . acetaminophen (TYLENOL) 500 MG tablet Take 2 tablets (1,000 mg total) by mouth every 6 (six) hours as needed. 30 tablet 0  . Ascorbic Acid (VITAMIN C PO) Take 1 tablet by mouth daily.    . furosemide (LASIX) 20 MG tablet TAKE 1 TABLET(20 MG) BY MOUTH DAILY 90 tablet 1  . ketoconazole (NIZORAL) 2 % cream Apply topically 2 (two) times daily.    Marland Kitchen lisinopril-hydrochlorothiazide (ZESTORETIC) 20-25 MG tablet TAKE 1 TABLET BY MOUTH EVERY DAY 90 tablet 3  . meloxicam (MOBIC) 15 MG tablet Take 1 tablet (15 mg total) by mouth daily. 30  tablet 0  . Multiple Vitamin (MULTIVITAMIN WITH MINERALS) TABS tablet Take 1 tablet by mouth daily.    . Omega-3 Fatty Acids (FISH OIL PO) Take 1 capsule by mouth daily.     No current facility-administered medications on file prior to visit.    Past Medical History:  Diagnosis Date  . Hypertension     History reviewed. No pertinent surgical history.  Social History   Socioeconomic History  . Marital status: Married    Spouse name: Not on file  . Number of children: Not on file  . Years of education: Not on file  . Highest education level: Not on file  Occupational History  . Occupation: CNA  Tobacco Use  . Smoking status: Never Smoker  . Smokeless tobacco: Never Used  Substance and Sexual Activity  . Alcohol use: Not on file  . Drug use: Not on file  . Sexual activity: Not on file  Other Topics Concern  . Not on file  Social History Narrative   From [place].   Social Determinants of Health   Financial Resource Strain:   . Difficulty of Paying Living Expenses: Not on file  Food Insecurity:   . Worried About Programme researcher, broadcasting/film/video in the Last Year: Not on file  . Ran Out of Food  in the Last Year: Not on file  Transportation Needs:   . Lack of Transportation (Medical): Not on file  . Lack of Transportation (Non-Medical): Not on file  Physical Activity:   . Days of Exercise per Week: Not on file  . Minutes of Exercise per Session: Not on file  Stress:   . Feeling of Stress : Not on file  Social Connections:   . Frequency of Communication with Friends and Family: Not on file  . Frequency of Social Gatherings with Friends and Family: Not on file  . Attends Religious Services: Not on file  . Active Member of Clubs or Organizations: Not on file  . Attends Banker Meetings: Not on file  . Marital Status: Not on file    Family History  Problem Relation Age of Onset  . Hypertension Mother   . Hypertension Father   . Hypertension Sister   . Hypertension  Brother     Review of Systems  Constitutional: Negative for fever.  Eyes: Positive for visual disturbance (double vision when driving).  Respiratory: Negative for shortness of breath.   Cardiovascular: Negative for chest pain, palpitations and leg swelling.  Endocrine: Positive for polyuria.  Neurological: Positive for dizziness (last thursday). Negative for weakness, light-headedness, numbness and headaches.       Objective:   Vitals:   07/04/20 1401  BP: (!) 152/72  Pulse: 94  Temp: 98.8 F (37.1 C)  SpO2: 99%   BP Readings from Last 3 Encounters:  07/04/20 (!) 152/72  03/10/20 116/72  02/16/20 126/76   Wt Readings from Last 3 Encounters:  07/04/20 153 lb (69.4 kg)  03/10/20 171 lb (77.6 kg)  02/16/20 170 lb (77.1 kg)   Body mass index is 23.96 kg/m.   Physical Exam    Constitutional: Appears well-developed and well-nourished. No distress.  Head: Normocephalic and atraumatic.  Neck: Neck supple. No tracheal deviation present. No thyromegaly present.  No cervical lymphadenopathy Cardiovascular: Normal rate, regular rhythm and normal heart sounds.  No murmur heard. No carotid bruit .  No edema Pulmonary/Chest: Effort normal and breath sounds normal. No respiratory distress. No has no wheezes. No rales. Neurological: Nonfocal Skin: Skin is warm and dry. Not diaphoretic.  Psychiatric: Normal mood and affect. Behavior is normal.       Assessment & Plan:    See Problem List for Assessment and Plan of chronic medical problems.    This visit occurred during the SARS-CoV-2 public health emergency.  Safety protocols were in place, including screening questions prior to the visit, additional usage of staff PPE, and extensive cleaning of exam room while observing appropriate contact time as indicated for disinfecting solutions.

## 2020-07-04 NOTE — Assessment & Plan Note (Signed)
Chronic Has been very well controlled up until recently.  Syncopal episode 3 days ago and he has been checking his blood pressure regularly since then and it has been elevated-150s-160s/70s Continue lisinopril-hydrochlorothiazide 20-25 mg-advise he can only take this once a day.  There was a day or 2 he took it more than once a day Start Bystolic 2.5 mg daily Advised him to keep a written log of his blood pressure and heart rate Follow-up with me in 2 weeks Check CBC, CMP

## 2020-07-04 NOTE — Patient Instructions (Addendum)
  Blood work was ordered.     Medications reviewed and updated.  Changes include :  bystolic 2.5 mg daily   Your prescription(s) have been submitted to your pharmacy. Please take as directed and contact our office if you believe you are having problem(s) with the medication(s).  A referral was ordered for cardiology.       Someone from their office will call you to schedule an appointment.    Follow up in 2 weeks

## 2020-07-04 NOTE — Assessment & Plan Note (Signed)
New problem Syncope 3 days ago just after urinating.  Hit his left posterior head, which is slightly sore now.  No neurological deficits so no need for imaging No prodromal symptoms-denies lightheadedness, dizziness, chest pain, palpitations shortness of breath EKG today shows normal sinus rhythm at 70 bpm, normal EKG.  This is unchanged compared to his EKG from 08/2019 CBC, CMP, A1c Possibly vasovagal in nature We will refer to cardiology for further evaluation

## 2020-07-05 ENCOUNTER — Telehealth: Payer: Self-pay | Admitting: Internal Medicine

## 2020-07-05 DIAGNOSIS — R55 Syncope and collapse: Secondary | ICD-10-CM | POA: Diagnosis not present

## 2020-07-05 DIAGNOSIS — R739 Hyperglycemia, unspecified: Secondary | ICD-10-CM | POA: Diagnosis not present

## 2020-07-05 DIAGNOSIS — Z23 Encounter for immunization: Secondary | ICD-10-CM

## 2020-07-05 DIAGNOSIS — I10 Essential (primary) hypertension: Secondary | ICD-10-CM | POA: Diagnosis not present

## 2020-07-05 LAB — HEMOGLOBIN A1C
Hgb A1c MFr Bld: 13.6 % of total Hgb — ABNORMAL HIGH (ref <5.7)
Mean Plasma Glucose: 344 (calc)
eAG (mmol/L): 19 (calc)

## 2020-07-05 MED ORDER — GLIPIZIDE 5 MG PO TABS: 5.0000 mg | ORAL_TABLET | Freq: Two times a day (BID) | ORAL | 3 refills | Status: DC

## 2020-07-05 MED ORDER — METFORMIN HCL 500 MG PO TABS: 500.0000 mg | ORAL_TABLET | Freq: Two times a day (BID) | ORAL | 3 refills | Status: DC

## 2020-07-05 NOTE — Telephone Encounter (Signed)
Left message for patient to return call to clinic to discuss results.  I also sent him a my-chart message as well with results and medications being called in.  Appointment was already set for 07/18/20.

## 2020-07-05 NOTE — Telephone Encounter (Signed)
Please call him - let him know his sugars over the past three months have been very high - he now has diabetes.  That is likely the cause of his blurry vision and increase in urination.  We need to start medication for his sugars.  He needs to stop eating all sweets/sugars.    He will start metformin 500 mg twice daily with meals.  We will also start a second medication for his sugars - glipizide.  Two meds sent to [facility]   His kidney function is decreased - this may be from the sugars has well.    He needs to follow up with me as scheduled in two weeks.

## 2020-07-06 NOTE — Telephone Encounter (Signed)
Last read by Leander Rams at 7:20 PM on 07/05/2020.

## 2020-07-07 ENCOUNTER — Telehealth: Payer: Self-pay | Admitting: Internal Medicine

## 2020-07-07 NOTE — Telephone Encounter (Signed)
I do not think it is from one of his medications.  Most likely it is from his sugars or abnormal blood work.   He can come in next week instead of on the 8th if needed

## 2020-07-07 NOTE — Telephone Encounter (Signed)
Patient states after taking the medication he was prescribed on 07/04/20 he has developed hiccups that will not go away, has been hiccuping since last night

## 2020-07-07 NOTE — Telephone Encounter (Signed)
Spoke with patient. Appointment made for 07/11/20 and 07/18/20 appointment cancelled.

## 2020-07-10 DIAGNOSIS — E119 Type 2 diabetes mellitus without complications: Secondary | ICD-10-CM | POA: Insufficient documentation

## 2020-07-10 NOTE — Patient Instructions (Addendum)
  Blood work was ordered.     Medications reviewed and updated.  Changes include :   Start omeprazole 40 mg daily for 2 weeks for your hiccups and heartburn  Your prescription(s) have been submitted to your pharmacy. Please take as directed and contact our office if you believe you are having problem(s) with the medication(s).   Please followup in December

## 2020-07-10 NOTE — Progress Notes (Signed)
Subjective:    Patient ID: Richard Lucas, male    DOB: [birth year], 69 y.o.   MRN: [id number]  HPI The patient is here for follow up of their chronic medical problems, including new onset DM, htn  With his last blood work he was diagnosed with diabetes.  He has been drinking a lot of juice. He was doing that for vitamin C. He does not eat a lot of sweets.  He has stopped drinking the juice.   He denies blurry vision since his last visit.  His urination has decreased.   He denies excessive thirst.  He has GERD daily.  This started last week.  He had called last week complaining of hiccups and wondered if it was related to his new medication previously he did not have GERD. He is drinking and eating more alkaline food to try to help.     Medications and allergies reviewed with patient and updated if appropriate.  Patient Active Problem List   Diagnosis Date Noted  . GERD (gastroesophageal reflux disease) 07/11/2020  . Diabetes (HCC) 07/10/2020  . Syncope 07/04/2020  . Hyperglycemia 07/04/2020  . Leg swelling 02/17/2020  . Routine general medical examination at a health care facility 06/18/2017  . Erectile dysfunction 12/19/2015  . Essential hypertension 07/26/2014    Current Outpatient Medications on File Prior to Visit  Medication Sig Dispense Refill  . acetaminophen (TYLENOL) 500 MG tablet Take 2 tablets (1,000 mg total) by mouth every 6 (six) hours as needed. 30 tablet 0  . Ascorbic Acid (VITAMIN C PO) Take 1 tablet by mouth daily.    . furosemide (LASIX) 20 MG tablet TAKE 1 TABLET(20 MG) BY MOUTH DAILY 90 tablet 1  . glipiZIDE (GLUCOTROL) 5 MG tablet Take 1 tablet (5 mg total) by mouth 2 (two) times daily before a meal. 60 tablet 3  . ketoconazole (NIZORAL) 2 % cream Apply topically 2 (two) times daily.    Marland Kitchen lisinopril-hydrochlorothiazide (ZESTORETIC) 20-25 MG tablet Take 1 tablet by mouth daily. 90 tablet 0  . metFORMIN (GLUCOPHAGE) 500 MG tablet Take 1 tablet (500 mg  total) by mouth 2 (two) times daily with a meal. 180 tablet 3  . Multiple Vitamin (MULTIVITAMIN WITH MINERALS) TABS tablet Take 1 tablet by mouth daily.    . nebivolol (BYSTOLIC) 2.5 MG tablet Take 1 tablet (2.5 mg total) by mouth daily. 30 tablet 5  . Omega-3 Fatty Acids (FISH OIL PO) Take 1 capsule by mouth daily.     No current facility-administered medications on file prior to visit.    Past Medical History:  Diagnosis Date  . Hypertension     History reviewed. No pertinent surgical history.  Social History   Socioeconomic History  . Marital status: Married    Spouse name: Not on file  . Number of children: Not on file  . Years of education: Not on file  . Highest education level: Not on file  Occupational History  . Occupation: CNA  Tobacco Use  . Smoking status: Never Smoker  . Smokeless tobacco: Never Used  Substance and Sexual Activity  . Alcohol use: Not on file  . Drug use: Not on file  . Sexual activity: Not on file  Other Topics Concern  . Not on file  Social History Narrative   From [place].   Social Determinants of Health   Financial Resource Strain:   . Difficulty of Paying Living Expenses: Not on file  Food Insecurity:   . Worried  About Running Out of Food in the Last Year: Not on file  . Ran Out of Food in the Last Year: Not on file  Transportation Needs:   . Lack of Transportation (Medical): Not on file  . Lack of Transportation (Non-Medical): Not on file  Physical Activity:   . Days of Exercise per Week: Not on file  . Minutes of Exercise per Session: Not on file  Stress:   . Feeling of Stress : Not on file  Social Connections:   . Frequency of Communication with Friends and Family: Not on file  . Frequency of Social Gatherings with Friends and Family: Not on file  . Attends Religious Services: Not on file  . Active Member of Clubs or Organizations: Not on file  . Attends Banker Meetings: Not on file  . Marital Status: Not on  file    Family History  Problem Relation Age of Onset  . Hypertension Mother   . Hypertension Father   . Hypertension Sister   . Hypertension Brother     Review of Systems  Constitutional: Negative for fever.  Eyes: Negative for visual disturbance.  Respiratory: Negative for cough, shortness of breath and wheezing.   Cardiovascular: Negative for chest pain, palpitations and leg swelling.  Endocrine: Negative for polydipsia.  Neurological: Negative for dizziness, light-headedness and headaches.       Objective:   Vitals:   07/11/20 1044  BP: 124/70  Pulse: 68  Temp: 98.6 F (37 C)  SpO2: 94%   BP Readings from Last 3 Encounters:  07/11/20 124/70  07/04/20 (!) 152/72  03/10/20 116/72   Wt Readings from Last 3 Encounters:  07/11/20 159 lb (72.1 kg)  07/04/20 153 lb (69.4 kg)  03/10/20 171 lb (77.6 kg)   Body mass index is 24.9 kg/m.   Physical Exam    Constitutional: Appears well-developed and well-nourished. No distress.  HENT:  Head: Normocephalic and atraumatic.  Neck: Neck supple. No tracheal deviation present. No thyromegaly present.  No cervical lymphadenopathy Cardiovascular: Normal rate, regular rhythm and normal heart sounds.   No murmur heard. No carotid bruit .  No edema Pulmonary/Chest: Giving intermittently throughout visit.  Effort normal and breath sounds normal. No respiratory distress. No has no wheezes. No rales.  Skin: Skin is warm and dry. Not diaphoretic.  Psychiatric: Normal mood and affect. Behavior is normal.      Assessment & Plan:    See Problem List for Assessment and Plan of chronic medical problems.    This visit occurred during the SARS-CoV-2 public health emergency.  Safety protocols were in place, including screening questions prior to the visit, additional usage of staff PPE, and extensive cleaning of exam room while observing appropriate contact time as indicated for disinfecting solutions.

## 2020-07-11 ENCOUNTER — Ambulatory Visit (INDEPENDENT_AMBULATORY_CARE_PROVIDER_SITE_OTHER): Payer: Medicare HMO | Admitting: Internal Medicine

## 2020-07-11 ENCOUNTER — Other Ambulatory Visit: Payer: Self-pay

## 2020-07-11 ENCOUNTER — Encounter: Payer: Self-pay | Admitting: Internal Medicine

## 2020-07-11 VITALS — BP 124/70 | HR 68 | Temp 98.6°F | Ht 67.0 in | Wt 159.0 lb

## 2020-07-11 DIAGNOSIS — E1165 Type 2 diabetes mellitus with hyperglycemia: Secondary | ICD-10-CM

## 2020-07-11 DIAGNOSIS — I10 Essential (primary) hypertension: Secondary | ICD-10-CM

## 2020-07-11 DIAGNOSIS — K219 Gastro-esophageal reflux disease without esophagitis: Secondary | ICD-10-CM | POA: Insufficient documentation

## 2020-07-11 LAB — COMPREHENSIVE METABOLIC PANEL
ALT: 25 U/L (ref 0–53)
AST: 19 U/L (ref 0–37)
Albumin: 4.1 g/dL (ref 3.5–5.2)
Alkaline Phosphatase: 60 U/L (ref 39–117)
BUN: 12 mg/dL (ref 6–23)
CO2: 29 mEq/L (ref 19–32)
Calcium: 9.1 mg/dL (ref 8.4–10.5)
Chloride: 98 mEq/L (ref 96–112)
Creatinine, Ser: 1.08 mg/dL (ref 0.40–1.50)
GFR: 68.98 mL/min (ref >60.00)
Glucose, Bld: 260 mg/dL — ABNORMAL HIGH (ref 70–99)
Potassium: 3.9 mEq/L (ref 3.5–5.1)
Sodium: 135 mEq/L (ref 135–145)
Total Bilirubin: 1.1 mg/dL (ref 0.2–1.2)
Total Protein: 7.2 g/dL (ref 6.0–8.3)

## 2020-07-11 MED ORDER — OMEPRAZOLE 40 MG PO CPDR: 40.0000 mg | DELAYED_RELEASE_CAPSULE | Freq: Every day | ORAL | 0 refills | Status: DC

## 2020-07-11 NOTE — Assessment & Plan Note (Addendum)
Chronic BP well controlled Continue lininopril-hctz 20-25 mg daily and bystolic 2.5 mg daily cmp He is monitoring his blood pressure at home and advised to bring his readings into his next visit

## 2020-07-11 NOTE — Assessment & Plan Note (Addendum)
New problem Lab Results  Component Value Date   HGBA1C 13.6 (H) 07/04/2020   Started on metformin 500 mg BID and glipizde 5 mg BID - will continue His wife is a nutritionist - deferred referral to diabetic education He was drinking a lot of juice, which seems to be the cause-he has stopped this Follow up in December with PCP as scheduled

## 2020-07-11 NOTE — Assessment & Plan Note (Addendum)
New Have GERD and Hiccups-started last week and has improved some, but still symptomatic Start omeprazole 40 mg daily x 2 weeks.

## 2020-07-14 ENCOUNTER — Encounter: Payer: Self-pay | Admitting: General Practice

## 2020-07-18 ENCOUNTER — Ambulatory Visit: Payer: Medicare HMO | Admitting: Internal Medicine

## 2020-07-18 ENCOUNTER — Telehealth: Payer: Self-pay | Admitting: Internal Medicine

## 2020-07-18 DIAGNOSIS — E1165 Type 2 diabetes mellitus with hyperglycemia: Secondary | ICD-10-CM

## 2020-07-18 DIAGNOSIS — R69 Illness, unspecified: Secondary | ICD-10-CM | POA: Diagnosis not present

## 2020-07-18 MED ORDER — BLOOD GLUCOSE MONITOR KIT: PACK | 0 refills | Status: AC

## 2020-07-18 NOTE — Telephone Encounter (Signed)
printed

## 2020-07-18 NOTE — Telephone Encounter (Signed)
Faxed today

## 2020-07-18 NOTE — Telephone Encounter (Signed)
    Patient requesting order for glucometer kit and test strips, please send to [place], [place]

## 2020-07-23 ENCOUNTER — Other Ambulatory Visit: Payer: Self-pay | Admitting: Internal Medicine

## 2020-08-25 ENCOUNTER — Ambulatory Visit (INDEPENDENT_AMBULATORY_CARE_PROVIDER_SITE_OTHER): Payer: Medicare HMO | Admitting: Internal Medicine

## 2020-08-25 ENCOUNTER — Other Ambulatory Visit: Payer: Self-pay

## 2020-08-25 ENCOUNTER — Encounter: Payer: Self-pay | Admitting: Internal Medicine

## 2020-08-25 VITALS — BP 162/78 | HR 73 | Temp 98.3°F | Ht 67.0 in | Wt 162.0 lb

## 2020-08-25 DIAGNOSIS — I10 Essential (primary) hypertension: Secondary | ICD-10-CM

## 2020-08-25 DIAGNOSIS — E1165 Type 2 diabetes mellitus with hyperglycemia: Secondary | ICD-10-CM

## 2020-08-25 DIAGNOSIS — R634 Abnormal weight loss: Secondary | ICD-10-CM

## 2020-08-25 DIAGNOSIS — Z0000 Encounter for general adult medical examination without abnormal findings: Secondary | ICD-10-CM

## 2020-08-25 LAB — CBC
HCT: 38.8 % — ABNORMAL LOW (ref 39.0–52.0)
Hemoglobin: 13 g/dL (ref 13.0–17.0)
MCHC: 33.5 g/dL (ref 30.0–36.0)
MCV: 86.7 fl (ref 78.0–100.0)
Platelets: 144 10*3/uL — ABNORMAL LOW (ref 150.0–400.0)
RBC: 4.47 Mil/uL (ref 4.22–5.81)
RDW: 15.9 % — ABNORMAL HIGH (ref 11.5–15.5)
WBC: 5.6 10*3/uL (ref 4.0–10.5)

## 2020-08-25 LAB — COMPREHENSIVE METABOLIC PANEL
ALT: 22 U/L (ref 0–53)
AST: 22 U/L (ref 0–37)
Albumin: 4.5 g/dL (ref 3.5–5.2)
Alkaline Phosphatase: 49 U/L (ref 39–117)
BUN: 15 mg/dL (ref 6–23)
CO2: 31 mEq/L (ref 19–32)
Calcium: 9.7 mg/dL (ref 8.4–10.5)
Chloride: 101 mEq/L (ref 96–112)
Creatinine, Ser: 1.23 mg/dL (ref 0.40–1.50)
GFR: 58.96 mL/min — ABNORMAL LOW (ref >60.00)
Glucose, Bld: 97 mg/dL (ref 70–99)
Potassium: 3.5 mEq/L (ref 3.5–5.1)
Sodium: 139 mEq/L (ref 135–145)
Total Bilirubin: 0.6 mg/dL (ref 0.2–1.2)
Total Protein: 7.9 g/dL (ref 6.0–8.3)

## 2020-08-25 LAB — LIPID PANEL
Cholesterol: 160 mg/dL (ref 0–200)
HDL: 44.6 mg/dL (ref >39.00)
LDL Cholesterol: 86 mg/dL (ref 0–99)
NonHDL: 115.4
Total CHOL/HDL Ratio: 4
Triglycerides: 148 mg/dL (ref 0.0–149.0)
VLDL: 29.6 mg/dL (ref 0.0–40.0)

## 2020-08-25 LAB — MICROALBUMIN / CREATININE URINE RATIO
Creatinine,U: 78.6 mg/dL
Microalb Creat Ratio: 0.9 mg/g (ref 0.0–30.0)
Microalb, Ur: <0.7 mg/dL (ref 0.0–1.9)

## 2020-08-25 NOTE — Progress Notes (Signed)
Subjective:   Patient ID: Richard Lucas, male    DOB: 06/30/1951, 69 y.o.   MRN: [id number]  HPI Here for medicare wellness and physical, no new complaints. Please see A/P for status and treatment of chronic medical problems.   Diet: DM since newly diabetic Physical activity: sedentary Depression/mood screen: negative Hearing: intact to whispered voice, mild loss bilaterally Visual acuity: grossly normal with lens, overdue for annual eye exam  ADLs: capable Fall risk: none Home safety: good Cognitive evaluation: intact to orientation, naming, recall and repetition EOL planning: adv directives discussed  Flowsheet Row Office Visit from 07/04/2020 in [facility] Total Score 0      I have personally reviewed and have noted 1. The patient's medical and social history - reviewed today no changes 2. Their use of alcohol, tobacco or illicit drugs 3. Their current medications and supplements 4. The patient's functional ability including ADL's, fall risks, home safety risks and hearing or visual impairment. 5. Diet and physical activities 6. Evidence for depression or mood disorders 7. Care team reviewed and updated  Patient Care Team: Myrlene Broker, MD as PCP - General (Internal Medicine) Past Medical History:  Diagnosis Date   Hypertension    History reviewed. No pertinent surgical history. Family History  Problem Relation Age of Onset   Hypertension Mother    Hypertension Father    Hypertension Sister    Hypertension Brother    Review of Systems  Constitutional: Positive for unexpected weight change.  HENT: Negative.   Eyes: Negative.   Respiratory: Negative for cough, chest tightness and shortness of breath.   Cardiovascular: Negative for chest pain, palpitations and leg swelling.  Gastrointestinal: Negative for abdominal distention, abdominal pain, constipation, diarrhea, nausea and vomiting.  Musculoskeletal: Negative.    Skin: Negative.   Neurological: Negative.   Psychiatric/Behavioral: Negative.     Objective:  Physical Exam Constitutional:      Appearance: He is well-developed and well-nourished.  HENT:     Head: Normocephalic and atraumatic.  Eyes:     Extraocular Movements: EOM normal.  Cardiovascular:     Rate and Rhythm: Normal rate and regular rhythm.  Pulmonary:     Effort: Pulmonary effort is normal. No respiratory distress.     Breath sounds: Normal breath sounds. No wheezing or rales.  Abdominal:     General: Bowel sounds are normal. There is no distension.     Palpations: Abdomen is soft.     Tenderness: There is no abdominal tenderness. There is no rebound.  Musculoskeletal:        General: No edema.     Cervical back: Normal range of motion.  Skin:    General: Skin is warm and dry.     Comments: Foot exam done  Neurological:     Mental Status: He is alert and oriented to person, place, and time.     Coordination: Coordination normal.  Psychiatric:        Mood and Affect: Mood and affect normal.     Vitals:   08/25/20 1023  BP: (!) 162/78  Pulse: 73  Temp: 98.3 F (36.8 C)  TempSrc: Oral  SpO2: 94%  Weight: 162 lb (73.5 kg)  Height: 5\' 7"  (1.702 m)   This visit occurred during the SARS-CoV-2 public health emergency.  Safety protocols were in place, including screening questions prior to the visit, additional usage of staff PPE, and extensive cleaning of exam room while observing appropriate contact  time as indicated for disinfecting solutions.   Assessment & Plan:

## 2020-08-25 NOTE — Patient Instructions (Signed)
We are checking the labs today and will let you know about the results.   Health Maintenance, Male Adopting a healthy lifestyle and getting preventive care are important in promoting health and wellness. Ask your health care provider about:  The right schedule for you to have regular tests and exams.  Things you can do on your own to prevent diseases and keep yourself healthy. What should I know about diet, weight, and exercise? Eat a healthy diet   Eat a diet that includes plenty of vegetables, fruits, low-fat dairy products, and lean protein.  Do not eat a lot of foods that are high in solid fats, added sugars, or sodium. Maintain a healthy weight Body mass index (BMI) is a measurement that can be used to identify possible weight problems. It estimates body fat based on height and weight. Your health care provider can help determine your BMI and help you achieve or maintain a healthy weight. Get regular exercise Get regular exercise. This is one of the most important things you can do for your health. Most adults should:  Exercise for at least 150 minutes each week. The exercise should increase your heart rate and make you sweat (moderate-intensity exercise).  Do strengthening exercises at least twice a week. This is in addition to the moderate-intensity exercise.  Spend less time sitting. Even light physical activity can be beneficial. Watch cholesterol and blood lipids Have your blood tested for lipids and cholesterol at 71 years of age, then have this test every 5 years. You may need to have your cholesterol levels checked more often if:  Your lipid or cholesterol levels are high.  You are older than 71 years of age.  You are at high risk for heart disease. What should I know about cancer screening? Many types of cancers can be detected early and may often be prevented. Depending on your health history and family history, you may need to have cancer screening at various ages.  This may include screening for:  Colorectal cancer.  Prostate cancer.  Skin cancer.  Lung cancer. What should I know about heart disease, diabetes, and high blood pressure? Blood pressure and heart disease  High blood pressure causes heart disease and increases the risk of stroke. This is more likely to develop in people who have high blood pressure readings, are of African descent, or are overweight.  Talk with your health care provider about your target blood pressure readings.  Have your blood pressure checked: ? Every 3-5 years if you are 35-85 years of age. ? Every year if you are 42 years old or older.  If you are between the ages of 13 and 64 and are a current or former smoker, ask your health care provider if you should have a one-time screening for abdominal aortic aneurysm (AAA). Diabetes Have regular diabetes screenings. This checks your fasting blood sugar level. Have the screening done:  Once every three years after age 81 if you are at a normal weight and have a low risk for diabetes.  More often and at a younger age if you are overweight or have a high risk for diabetes. What should I know about preventing infection? Hepatitis B If you have a higher risk for hepatitis B, you should be screened for this virus. Talk with your health care provider to find out if you are at risk for hepatitis B infection. Hepatitis C Blood testing is recommended for:  Everyone born from [birth year] through 1967.  Anyone with known  risk factors for hepatitis C. Sexually transmitted infections (STIs)  You should be screened each year for STIs, including gonorrhea and chlamydia, if: ? You are sexually active and are younger than 71 years of age. ? You are older than 71 years of age and your health care provider tells you that you are at risk for this type of infection. ? Your sexual activity has changed since you were last screened, and you are at increased risk for chlamydia or gonorrhea.  Ask your health care provider if you are at risk.  Ask your health care provider about whether you are at high risk for HIV. Your health care provider may recommend a prescription medicine to help prevent HIV infection. If you choose to take medicine to prevent HIV, you should first get tested for HIV. You should then be tested every 3 months for as long as you are taking the medicine. Follow these instructions at home: Lifestyle  Do not use any products that contain nicotine or tobacco, such as cigarettes, e-cigarettes, and chewing tobacco. If you need help quitting, ask your health care provider.  Do not use street drugs.  Do not share needles.  Ask your health care provider for help if you need support or information about quitting drugs. Alcohol use  Do not drink alcohol if your health care provider tells you not to drink.  If you drink alcohol: ? Limit how much you have to 0-2 drinks a day. ? Be aware of how much alcohol is in your drink. In the [place] one drink equals one 12 oz bottle of beer (355 mL), one 5 oz glass of wine (148 mL), or one 1 oz glass of hard liquor (44 mL). General instructions  Schedule regular health, dental, and eye exams.  Stay current with your vaccines.  Tell your health care provider if: ? You often feel depressed. ? You have ever been abused or do not feel safe at home. Summary  Adopting a healthy lifestyle and getting preventive care are important in promoting health and wellness.  Follow your health care provider's instructions about healthy diet, exercising, and getting tested or screened for diseases.  Follow your health care provider's instructions on monitoring your cholesterol and blood pressure. This information is not intended to replace advice given to you by your health care provider. Make sure you discuss any questions you have with your health care provider. Document Revised: 08/20/2018 Document Reviewed: 08/20/2018 Elsevier Patient  Education  2020 [employer].

## 2020-08-26 DIAGNOSIS — R634 Abnormal weight loss: Secondary | ICD-10-CM | POA: Insufficient documentation

## 2020-08-26 NOTE — Assessment & Plan Note (Addendum)
Given new diabetes without adequate explanation in combination with 10 pound weight loss since last year without dietary explanation will check CT abdomen and pelvis. Checking CMP today. No prior colonoscopy and did counsel about that today.

## 2020-08-26 NOTE — Assessment & Plan Note (Signed)
Flu shot complete. Covid-19 2 shots plus booster will bring card. Pneumonia complete. Shingrix counseled. Tetanus due 2026. Colonoscopy due declines. Counseled about sun safety and mole surveillance. Counseled about the dangers of distracted driving. Given 10 year screening recommendations.

## 2020-08-26 NOTE — Assessment & Plan Note (Signed)
Foot exam done, reminded about eye exam. Taking metformin and glipizide. Not on statin due to new diagnosis and medication overload initially. Checking lipid panel and will start statin depending on results. On ACE-I. Not due for HgA1c yet and will wait 2 months or so.

## 2020-08-26 NOTE — Assessment & Plan Note (Signed)
BP mildly elevated today but normal on current regimen. He states did not take meds this morning and was upset/anxious last night.

## 2020-08-30 ENCOUNTER — Other Ambulatory Visit: Payer: Self-pay | Admitting: Internal Medicine

## 2020-09-01 ENCOUNTER — Other Ambulatory Visit: Payer: Self-pay | Admitting: Internal Medicine

## 2020-09-07 ENCOUNTER — Other Ambulatory Visit: Payer: Self-pay | Admitting: Internal Medicine

## 2020-09-07 DIAGNOSIS — R1013 Epigastric pain: Secondary | ICD-10-CM

## 2020-09-07 MED ORDER — SIMVASTATIN 20 MG PO TABS: 20.0000 mg | ORAL_TABLET | Freq: Every day | ORAL | 3 refills | Status: DC

## 2020-09-11 ENCOUNTER — Other Ambulatory Visit: Payer: Self-pay | Admitting: Internal Medicine

## 2020-09-28 ENCOUNTER — Other Ambulatory Visit: Payer: Self-pay

## 2020-09-28 ENCOUNTER — Ambulatory Visit
Admission: RE | Admit: 2020-09-28 | Discharge: 2020-09-28 | Disposition: A | Discharge status: Home / Self Care | Payer: Medicare HMO | Source: Ambulatory Visit | Attending: Internal Medicine | Admitting: Internal Medicine

## 2020-09-28 DIAGNOSIS — K5730 Diverticulosis of large intestine without perforation or abscess without bleeding: Secondary | ICD-10-CM | POA: Diagnosis not present

## 2020-09-28 DIAGNOSIS — R634 Abnormal weight loss: Secondary | ICD-10-CM | POA: Diagnosis not present

## 2020-09-28 DIAGNOSIS — N400 Enlarged prostate without lower urinary tract symptoms: Secondary | ICD-10-CM | POA: Diagnosis not present

## 2020-09-28 DIAGNOSIS — R1013 Epigastric pain: Secondary | ICD-10-CM

## 2020-09-28 DIAGNOSIS — N5089 Other specified disorders of the male genital organs: Secondary | ICD-10-CM | POA: Diagnosis not present

## 2020-09-28 MED ORDER — IOPAMIDOL (ISOVUE-300) INJECTION 61%
80.0000 mL | Freq: Once | INTRAVENOUS | Status: AC | PRN
  Administered 2020-09-28: 80 mL via INTRAVENOUS

## 2020-10-07 DIAGNOSIS — R350 Frequency of micturition: Secondary | ICD-10-CM | POA: Diagnosis not present

## 2020-10-07 DIAGNOSIS — N401 Enlarged prostate with lower urinary tract symptoms: Secondary | ICD-10-CM | POA: Diagnosis not present

## 2020-10-07 DIAGNOSIS — N486 Induration penis plastica: Secondary | ICD-10-CM | POA: Diagnosis not present

## 2020-10-07 DIAGNOSIS — N5201 Erectile dysfunction due to arterial insufficiency: Secondary | ICD-10-CM | POA: Diagnosis not present

## 2020-10-21 ENCOUNTER — Other Ambulatory Visit: Payer: Self-pay | Admitting: Internal Medicine

## 2020-11-04 ENCOUNTER — Other Ambulatory Visit: Payer: Self-pay | Admitting: Internal Medicine

## 2020-11-08 ENCOUNTER — Encounter: Payer: Self-pay | Admitting: Internal Medicine

## 2020-11-08 ENCOUNTER — Other Ambulatory Visit: Payer: Self-pay

## 2020-11-08 ENCOUNTER — Ambulatory Visit (INDEPENDENT_AMBULATORY_CARE_PROVIDER_SITE_OTHER): Payer: Medicare HMO | Admitting: Internal Medicine

## 2020-11-08 VITALS — BP 122/78 | HR 69 | Temp 98.2°F | Resp 18 | Ht 67.0 in | Wt 154.0 lb

## 2020-11-08 DIAGNOSIS — R634 Abnormal weight loss: Secondary | ICD-10-CM

## 2020-11-08 DIAGNOSIS — E1165 Type 2 diabetes mellitus with hyperglycemia: Secondary | ICD-10-CM

## 2020-11-08 LAB — COMPREHENSIVE METABOLIC PANEL
ALT: 23 U/L (ref 0–53)
AST: 27 U/L (ref 0–37)
Albumin: 4.4 g/dL (ref 3.5–5.2)
Alkaline Phosphatase: 49 U/L (ref 39–117)
BUN: 15 mg/dL (ref 6–23)
CO2: 32 mEq/L (ref 19–32)
Calcium: 9.8 mg/dL (ref 8.4–10.5)
Chloride: 100 mEq/L (ref 96–112)
Creatinine, Ser: 1.17 mg/dL (ref 0.40–1.50)
GFR: 62.52 mL/min (ref >60.00)
Glucose, Bld: 118 mg/dL — ABNORMAL HIGH (ref 70–99)
Potassium: 3.4 mEq/L — ABNORMAL LOW (ref 3.5–5.1)
Sodium: 139 mEq/L (ref 135–145)
Total Bilirubin: 1.2 mg/dL (ref 0.2–1.2)
Total Protein: 7.7 g/dL (ref 6.0–8.3)

## 2020-11-08 LAB — HEMOGLOBIN A1C: Hgb A1c MFr Bld: 5.3 % (ref 4.6–6.5)

## 2020-11-08 MED ORDER — TRIAMCINOLONE ACETONIDE 0.1 % EX OINT: TOPICAL_OINTMENT | Freq: Two times a day (BID) | CUTANEOUS | 3 refills | Status: DC

## 2020-11-08 MED ORDER — GLIPIZIDE 5 MG PO TABS: 5.0000 mg | ORAL_TABLET | Freq: Two times a day (BID) | ORAL | 3 refills | Status: DC

## 2020-11-08 NOTE — Assessment & Plan Note (Signed)
Weight is still decreasing and reviewed CT abdomen with him and no findings to explain.

## 2020-11-08 NOTE — Progress Notes (Signed)
   Subjective:   Patient ID: Richard Lucas, male    DOB: 1951/03/11, 70 y.o.   MRN: [id number]  HPI The patient is a 70 YO man coming in for follow up diabetes. Started metformin and glipizide. Denies low sugars. Denies new numbness or tingling. Does monitor sugars and states that they seem to be coming down but sometime go high. Does not specify numbers.   Review of Systems  Constitutional: Negative.   HENT: Negative.   Eyes: Negative.   Respiratory: Negative for cough, chest tightness and shortness of breath.   Cardiovascular: Negative for chest pain, palpitations and leg swelling.  Gastrointestinal: Negative for abdominal distention, abdominal pain, constipation, diarrhea, nausea and vomiting.  Musculoskeletal: Negative.   Skin: Negative.   Neurological: Negative.   Psychiatric/Behavioral: Negative.     Objective:  Physical Exam Constitutional:      Appearance: He is well-developed and well-nourished.  HENT:     Head: Normocephalic and atraumatic.  Eyes:     Extraocular Movements: EOM normal.  Cardiovascular:     Rate and Rhythm: Normal rate and regular rhythm.  Pulmonary:     Effort: Pulmonary effort is normal. No respiratory distress.     Breath sounds: Normal breath sounds. No wheezing or rales.  Abdominal:     General: Bowel sounds are normal. There is no distension.     Palpations: Abdomen is soft.     Tenderness: There is no abdominal tenderness. There is no rebound.  Musculoskeletal:        General: No edema.     Cervical back: Normal range of motion.  Skin:    General: Skin is warm and dry.  Neurological:     Mental Status: He is alert and oriented to person, place, and time.     Coordination: Coordination normal.  Psychiatric:        Mood and Affect: Mood and affect normal.     Vitals:   11/08/20 1101  BP: 122/78  Pulse: 69  Resp: 18  Temp: 98.2 F (36.8 C)  TempSrc: Oral  SpO2: 98%  Weight: 154 lb (69.9 kg)  Height: 5\' 7"  (1.702 m)    This  visit occurred during the SARS-CoV-2 public health emergency.  Safety protocols were in place, including screening questions prior to the visit, additional usage of staff PPE, and extensive cleaning of exam room while observing appropriate contact time as indicated for disinfecting solutions.   Assessment & Plan:

## 2020-11-08 NOTE — Assessment & Plan Note (Signed)
Checking HgA1c today. Taking metformin and glipizide and adjust as needed.

## 2020-11-08 NOTE — Patient Instructions (Signed)
We will check the blood work today. ° ° °

## 2020-11-12 ENCOUNTER — Other Ambulatory Visit: Payer: Self-pay | Admitting: Internal Medicine

## 2020-11-12 DIAGNOSIS — I10 Essential (primary) hypertension: Secondary | ICD-10-CM

## 2020-12-25 ENCOUNTER — Other Ambulatory Visit: Payer: Self-pay | Admitting: Internal Medicine

## 2021-01-04 ENCOUNTER — Telehealth: Payer: Self-pay | Admitting: Internal Medicine

## 2021-01-04 MED ORDER — ONETOUCH VERIO VI STRP: ORAL_STRIP | 3 refills | Status: DC

## 2021-01-04 NOTE — Telephone Encounter (Signed)
Medication has been sent to the patient's pharmacy.  

## 2021-01-04 NOTE — Telephone Encounter (Signed)
Patient is asking for test strips for his glucose monitor, he test three times a day.  [facility] [phone] - [place], [place] - [address] Phone:  [phone]  Fax:  [phone]

## 2021-02-02 ENCOUNTER — Other Ambulatory Visit: Payer: Self-pay | Admitting: Internal Medicine

## 2021-02-02 DIAGNOSIS — I10 Essential (primary) hypertension: Secondary | ICD-10-CM

## 2021-03-10 ENCOUNTER — Telehealth: Payer: Self-pay | Admitting: Internal Medicine

## 2021-03-10 NOTE — Chronic Care Management (AMB) (Signed)
  Chronic Care Management   Outreach Note  03/10/2021 Name: Richard Lucas MRN: [id number] DOB: [birth year]  Referred by: Myrlene Broker, MD Reason for referral : No chief complaint on file.   An unsuccessful telephone outreach was attempted today. The patient was referred to the pharmacist for assistance with care management and care coordination.   Follow Up Plan:   Carmell [place] Upstream Scheduler

## 2021-04-04 ENCOUNTER — Telehealth: Payer: Self-pay | Admitting: Internal Medicine

## 2021-04-04 NOTE — Chronic Care Management (AMB) (Signed)
  Chronic Care Management   Outreach Note  04/04/2021 Name: Richard Lucas MRN: [id number] DOB: [birth year]  Referred by: Myrlene Broker, MD Reason for referral : No chief complaint on file.   A second unsuccessful telephone outreach was attempted today. The patient was referred to pharmacist for assistance with care management and care coordination.  Follow Up Plan:   Carmell [place] Upstream Scheduler

## 2021-04-04 NOTE — Chronic Care Management (AMB) (Signed)
  Chronic Care Management   Outreach Note  04/04/2021 Name: MIGUELANGEL KORN MRN: [id number] DOB: 07-12-1951  Referred by: Myrlene Broker, MD Reason for referral : No chief complaint on file.   An unsuccessful telephone outreach was attempted today. The patient was referred to the pharmacist for assistance with care management and care coordination.   Follow Up Plan:   Carmell [place] Upstream Scheduler

## 2021-04-20 ENCOUNTER — Telehealth: Payer: Self-pay | Admitting: Internal Medicine

## 2021-04-20 NOTE — Chronic Care Management (AMB) (Signed)
  Chronic Care Management   Outreach Note  04/20/2021 Name: Richard Lucas MRN: [id number] DOB: 11/06/1950  Referred by: Myrlene Broker, MD Reason for referral : No chief complaint on file.   Third unsuccessful telephone outreach was attempted today. The patient was referred to the pharmacist for assistance with care management and care coordination.   Follow Up Plan:   Tatjana Dellinger Upstream Scheduler

## 2021-06-03 ENCOUNTER — Other Ambulatory Visit: Payer: Self-pay | Admitting: Internal Medicine

## 2021-06-03 DIAGNOSIS — I10 Essential (primary) hypertension: Secondary | ICD-10-CM

## 2021-06-07 ENCOUNTER — Ambulatory Visit (INDEPENDENT_AMBULATORY_CARE_PROVIDER_SITE_OTHER): Payer: Medicare HMO | Admitting: Emergency Medicine

## 2021-06-07 ENCOUNTER — Encounter: Payer: Self-pay | Admitting: Emergency Medicine

## 2021-06-07 ENCOUNTER — Other Ambulatory Visit: Payer: Self-pay

## 2021-06-07 VITALS — BP 134/68 | HR 78 | Temp 98.1°F | Ht 67.0 in | Wt 153.0 lb

## 2021-06-07 DIAGNOSIS — I872 Venous insufficiency (chronic) (peripheral): Secondary | ICD-10-CM | POA: Diagnosis not present

## 2021-06-07 DIAGNOSIS — Z23 Encounter for immunization: Secondary | ICD-10-CM

## 2021-06-07 DIAGNOSIS — I152 Hypertension secondary to endocrine disorders: Secondary | ICD-10-CM | POA: Diagnosis not present

## 2021-06-07 DIAGNOSIS — I10 Essential (primary) hypertension: Secondary | ICD-10-CM

## 2021-06-07 DIAGNOSIS — R600 Localized edema: Secondary | ICD-10-CM | POA: Diagnosis not present

## 2021-06-07 DIAGNOSIS — E1159 Type 2 diabetes mellitus with other circulatory complications: Secondary | ICD-10-CM | POA: Diagnosis not present

## 2021-06-07 LAB — COMPREHENSIVE METABOLIC PANEL
ALT: 25 U/L (ref 0–53)
AST: 26 U/L (ref 0–37)
Albumin: 4.3 g/dL (ref 3.5–5.2)
Alkaline Phosphatase: 46 U/L (ref 39–117)
BUN: 13 mg/dL (ref 6–23)
CO2: 30 mEq/L (ref 19–32)
Calcium: 9.5 mg/dL (ref 8.4–10.5)
Chloride: 104 mEq/L (ref 96–112)
Creatinine, Ser: 1.08 mg/dL (ref 0.40–1.50)
GFR: 68.54 mL/min (ref >60.00)
Glucose, Bld: 89 mg/dL (ref 70–99)
Potassium: 3.5 mEq/L (ref 3.5–5.1)
Sodium: 142 mEq/L (ref 135–145)
Total Bilirubin: 1.2 mg/dL (ref 0.2–1.2)
Total Protein: 7.2 g/dL (ref 6.0–8.3)

## 2021-06-07 LAB — CBC WITH DIFFERENTIAL/PLATELET
Basophils Absolute: 0 10*3/uL (ref 0.0–0.1)
Basophils Relative: 0.4 % (ref 0.0–3.0)
Eosinophils Absolute: 0.1 10*3/uL (ref 0.0–0.7)
Eosinophils Relative: 1.9 % (ref 0.0–5.0)
HCT: 39.5 % (ref 39.0–52.0)
Hemoglobin: 13.1 g/dL (ref 13.0–17.0)
Lymphocytes Relative: 24.7 % (ref 12.0–46.0)
Lymphs Abs: 1.3 10*3/uL (ref 0.7–4.0)
MCHC: 33.3 g/dL (ref 30.0–36.0)
MCV: 86.7 fl (ref 78.0–100.0)
Monocytes Absolute: 0.3 10*3/uL (ref 0.1–1.0)
Monocytes Relative: 6.4 % (ref 3.0–12.0)
Neutro Abs: 3.6 10*3/uL (ref 1.4–7.7)
Neutrophils Relative %: 66.6 % (ref 43.0–77.0)
Platelets: 117 10*3/uL — ABNORMAL LOW (ref 150.0–400.0)
RBC: 4.55 Mil/uL (ref 4.22–5.81)
RDW: 13.8 % (ref 11.5–15.5)
WBC: 5.4 10*3/uL (ref 4.0–10.5)

## 2021-06-07 LAB — HEMOGLOBIN A1C: Hgb A1c MFr Bld: 5.3 % (ref 4.6–6.5)

## 2021-06-07 MED ORDER — FREESTYLE LIBRE 14 DAY READER DEVI: 3 refills | Status: DC

## 2021-06-07 MED ORDER — FREESTYLE LIBRE 14 DAY SENSOR MISC: 3 refills | Status: DC

## 2021-06-07 NOTE — Progress Notes (Signed)
Richard Lucas 70 y.o.   Chief Complaint  Patient presents with   Leg Swelling    Both legs, x yr    HISTORY OF PRESENT ILLNESS: This is a 70 y.o. male patient of Dr. Sharlet Salina with history of diabetes and hypertension complaining of intermittent swelling to both legs for the past year.  Denies difficulty breathing or chest pain. Denies any other associated symptoms. List of medications reviewed with patient. Lab Results  Component Value Date   HGBA1C 5.3 11/08/2020   BP Readings from Last 3 Encounters:  06/07/21 134/68  11/08/20 122/78  08/25/20 (!) 162/78     HPI   Prior to Admission medications   Medication Sig Start Date End Date Taking? Authorizing Provider  acetaminophen (TYLENOL) 500 MG tablet Take 2 tablets (1,000 mg total) by mouth every 6 (six) hours as needed. 11/04/17  Yes Charlesetta Shanks, MD  alfuzosin (UROXATRAL) 10 MG 24 hr tablet Take 10 mg by mouth at bedtime. 05/26/20  Yes [provider]  Ascorbic Acid (VITAMIN C PO) Take 1 tablet by mouth daily.   Yes [provider]  blood glucose meter kit and supplies KIT Dispense based on patient and insurance preference. Use daily and as needed to check sugars as directed. (FOR E11.65). 07/18/20  Yes Burns, Claudina Lick, MD  glipiZIDE (GLUCOTROL) 5 MG tablet Take 1 tablet (5 mg total) by mouth 2 (two) times daily before a meal. 11/08/20  Yes Hoyt Koch, MD  glucose blood [facility] VERIO) test strip TEST DAILY AS NEEDED 01/04/21  Yes Hoyt Koch, MD  Lancets (ONETOUCH DELICA PLUS [id number]) [place] TEST DAILY AS NEEDED 09/05/20  Yes Hoyt Koch, MD  lisinopril-hydrochlorothiazide (ZESTORETIC) 20-25 MG tablet TAKE 1 TABLET BY MOUTH DAILY 02/02/21  Yes Hoyt Koch, MD  metFORMIN (GLUCOPHAGE) 500 MG tablet Take 1 tablet (500 mg total) by mouth 2 (two) times daily with a meal. 07/05/20  Yes Burns, Claudina Lick, MD  Multiple Vitamin (MULTIVITAMIN WITH MINERALS) TABS tablet Take 1 tablet by  mouth daily.   Yes [provider]  nebivolol (BYSTOLIC) 2.5 MG tablet Take 1 tablet (2.5 mg total) by mouth daily. 07/04/20  Yes Burns, Claudina Lick, MD  Omega-3 Fatty Acids (FISH OIL PO) Take 1 capsule by mouth daily.   Yes [provider]  omeprazole (PRILOSEC) 40 MG capsule TAKE 1 CAPSULE(40 MG) BY MOUTH DAILY 30 MINUTES BEFORE A MEAL FOR 2 WEEKS 07/25/20  Yes Hoyt Koch, MD  simvastatin (ZOCOR) 20 MG tablet Take 1 tablet (20 mg total) by mouth at bedtime. 09/07/20  Yes Hoyt Koch, MD  triamcinolone ointment (KENALOG) 0.1 % Apply topically 2 (two) times daily. 11/08/20  Yes Hoyt Koch, MD    No Known Allergies  Patient Active Problem List   Diagnosis Date Noted   Hypertension 06/07/2021   Weight loss 08/26/2020   GERD (gastroesophageal reflux disease) 07/11/2020   Diabetes ([place]) 07/10/2020   Syncope 07/04/2020   Routine general medical examination at a health care facility 06/18/2017   Erectile dysfunction 12/19/2015   Essential hypertension 07/26/2014    Past Medical History:  Diagnosis Date   Hypertension     No past surgical history on file.  Social History   Socioeconomic History   Marital status: Married    Spouse name: Not on file   Number of children: Not on file   Years of education: Not on file   Highest education level: Not on file  Occupational  History   Occupation: CNA  Tobacco Use   Smoking status: Never   Smokeless tobacco: Never  Substance and Sexual Activity   Alcohol use: Not on file   Drug use: Not on file   Sexual activity: Not on file  Other Topics Concern   Not on file  Social History Narrative   From [place].   Social Determinants of Health   Financial Resource Strain: Not on file  Food Insecurity: Not on file  Transportation Needs: Not on file  Physical Activity: Not on file  Stress: Not on file  Social Connections: Not on file  Intimate Partner Violence: Not on file    Family History   Problem Relation Age of Onset   Hypertension Mother    Hypertension Father    Hypertension Sister    Hypertension Brother      Review of Systems  Constitutional: Negative.  Negative for chills and fever.  HENT: Negative.  Negative for congestion and sore throat.   Respiratory: Negative.  Negative for cough and shortness of breath.   Cardiovascular:  Positive for leg swelling. Negative for chest pain, palpitations, orthopnea and PND.  Gastrointestinal: Negative.  Negative for abdominal pain, diarrhea, nausea and vomiting.  Genitourinary: Negative.   Skin: Negative.   Neurological: Negative.  Negative for dizziness and headaches.  All other systems reviewed and are negative.   Physical Exam Vitals reviewed.  Constitutional:      Appearance: Normal appearance.  HENT:     Head: Normocephalic.  Eyes:     Extraocular Movements: Extraocular movements intact.     Pupils: Pupils are equal, round, and reactive to light.  Cardiovascular:     Rate and Rhythm: Normal rate and regular rhythm.     Pulses: Normal pulses.     Heart sounds: Normal heart sounds.  Pulmonary:     Effort: Pulmonary effort is normal.     Breath sounds: Normal breath sounds.  Musculoskeletal:     Cervical back: Normal range of motion.     Comments: Lower extremities: Warm to touch.  Stasis dermatitis skin changes noted bilaterally. Good peripheral pulses and good capillary refill.  Pitting edema +1 to +2 bilaterally  Skin:    General: Skin is warm and dry.     Capillary Refill: Capillary refill takes less than 2 seconds.  Neurological:     General: No focal deficit present.     Mental Status: He is alert and oriented to person, place, and time.  Psychiatric:        Mood and Affect: Mood normal.        Behavior: Behavior normal.     ASSESSMENT & PLAN: Problem List Items Addressed This Visit       Cardiovascular and Mediastinum   Essential hypertension    Well-controlled hypertension.  Continue  Zestoretic 20-25 mg daily and nebivolol 2.5 mg daily. BP Readings from Last 3 Encounters:  06/07/21 134/68  11/08/20 122/78  08/25/20 (!) 162/78  Dietary approaches to stop hypertension discussed. Recommended low-salt diet. Blood work done today. Follow-up with Dr. Sharlet Salina in the next several weeks.       Hypertension associated with diabetes ([place])   Relevant Medications   Continuous Blood Gluc Sensor (FREESTYLE LIBRE 14 DAY SENSOR) MISC   Continuous Blood Gluc Receiver (FREESTYLE LIBRE 14 DAY READER) DEVI   Other Relevant Orders   CBC with Differential/Platelet   Comprehensive metabolic panel   Hemoglobin A1c   Chronic venous insufficiency - Primary  Resulting in chronic intermittent edema.  Patient already taking diuretic. Advised to keep legs elevated when sitting down.  Low-salt diet. Compression socks as recommended. Blood work done today.      Other Visit Diagnoses     Bilateral leg edema       Flu vaccine need       Relevant Orders   Flu Vaccine QUAD High Dose(Fluad) (Completed)      Patient Instructions  Chronic Venous Insufficiency Chronic venous insufficiency is a condition where the leg veins cannot effectively pump blood from the legs to the heart. This happens when the vein walls are either stretched, weakened, or damaged, or when the valves inside the vein are damaged. With the right treatment, you should be able to continue with an active life. This condition is also called venous stasis. What are the causes? Common causes of this condition include: High blood pressure inside the veins (venous hypertension). Sitting or standing too long, causing increased blood pressure in the leg veins. A blood clot that blocks blood flow in a vein (deep vein thrombosis, DVT). Inflammation of a vein (phlebitis) that causes a blood clot to form. Tumors in the pelvis that cause blood to back up. What increases the risk? The following factors may make you more likely to  develop this condition: Having a family history of this condition. Obesity. Pregnancy. Living without enough regular physical activity or exercise (sedentary lifestyle). Smoking. Having a job that requires long periods of standing or sitting in one place. Being a certain age. Women in their 51s and 55s and men in their 23s are more likely to develop this condition. What are the signs or symptoms? Symptoms of this condition include: Veins that are enlarged, bulging, or twisted (varicose veins). Skin breakdown or ulcers. Reddened skin or dark discoloration of skin on the leg between the knee and ankle. Brown, smooth, tight, and painful skin just above the ankle, usually on the inside of the leg (lipodermatosclerosis). Swelling of the legs. How is this diagnosed? This condition may be diagnosed based on: Your medical history. A physical exam. Tests, such as: A procedure that creates an image of a blood vessel and nearby organs and provides information about blood flow through the blood vessel (duplex ultrasound). A procedure that tests blood flow (plethysmography). A procedure that looks at the veins using X-ray and dye (venogram). How is this treated? The goals of treatment are to help you return to an active life and to minimize pain or disability. Treatment depends on the severity of your condition, and it may include: Wearing compression stockings. These can help relieve symptoms and help prevent your condition from getting worse. However, they do not cure the condition. Sclerotherapy. This procedure involves an injection of a solution that shrinks damaged veins. Surgery. This may involve: Removing a diseased vein (vein stripping). Cutting off blood flow through the vein (laser ablation surgery). Repairing or reconstructing a valve within the affected vein. Follow these instructions at home:   Wear compression stockings as told by your health care provider. These stockings help to  prevent blood clots and reduce swelling in your legs. Take over-the-counter and prescription medicines only as told by your health care provider. Stay active by exercising, walking, or doing different activities. Ask your health care provider what activities are safe for you and how much exercise you need. Drink enough fluid to keep your urine pale yellow. Do not use any products that contain nicotine or tobacco, such as cigarettes, e-cigarettes,  and chewing tobacco. If you need help quitting, ask your health care provider. Keep all follow-up visits as told by your health care provider. This is important. Contact a health care provider if you: Have redness, swelling, or more pain in the affected area. See a red streak or line that goes up or down from the affected area. Have skin breakdown or skin loss in the affected area, even if the breakdown is small. Get an injury in the affected area. Get help right away if: You get an injury and an open wound in the affected area. You have: Severe pain that does not get better with medicine. Sudden numbness or weakness in the foot or ankle below the affected area. Trouble moving your foot or ankle. A fever. Worse or persistent symptoms. Chest pain. Shortness of breath. Summary Chronic venous insufficiency is a condition where the leg veins cannot effectively pump blood from the legs to the heart. Chronic venous insufficiency occurs when the vein walls become stretched, weakened, or damaged, or when valves within the vein are damaged. Treatment depends on how severe your condition is. It often involves wearing compression stockings and may involve having a procedure. Make sure you stay active by exercising, walking, or doing different activities. Ask your health care provider what activities are safe for you and how much exercise you need. This information is not intended to replace advice given to you by your health care provider. Make sure you  discuss any questions you have with your health care provider. Document Revised: 11/08/2020 Document Reviewed: 11/08/2020 Elsevier Patient Education  2022 [place], MD [facility]

## 2021-06-07 NOTE — Patient Instructions (Signed)
Chronic Venous Insufficiency Chronic venous insufficiency is a condition where the leg veins cannot effectively pump blood from the legs to the heart. This happens when the vein walls are either stretched, weakened, or damaged, or when the valves inside the vein are damaged. With the right treatment, you should be able to continue with an active life. This condition is also called venous stasis. What are the causes? Common causes of this condition include: High blood pressure inside the veins (venous hypertension). Sitting or standing too long, causing increased blood pressure in the leg veins. A blood clot that blocks blood flow in a vein (deep vein thrombosis, DVT). Inflammation of a vein (phlebitis) that causes a blood clot to form. Tumors in the pelvis that cause blood to back up. What increases the risk? The following factors may make you more likely to develop this condition: Having a family history of this condition. Obesity. Pregnancy. Living without enough regular physical activity or exercise (sedentary lifestyle). Smoking. Having a job that requires long periods of standing or sitting in one place. Being a certain age. Women in their 40s and 50s and men in their 70s are more likely to develop this condition. What are the signs or symptoms? Symptoms of this condition include: Veins that are enlarged, bulging, or twisted (varicose veins). Skin breakdown or ulcers. Reddened skin or dark discoloration of skin on the leg between the knee and ankle. Brown, smooth, tight, and painful skin just above the ankle, usually on the inside of the leg (lipodermatosclerosis). Swelling of the legs. How is this diagnosed? This condition may be diagnosed based on: Your medical history. A physical exam. Tests, such as: A procedure that creates an image of a blood vessel and nearby organs and provides information about blood flow through the blood vessel (duplex ultrasound). A procedure that  tests blood flow (plethysmography). A procedure that looks at the veins using X-ray and dye (venogram). How is this treated? The goals of treatment are to help you return to an active life and to minimize pain or disability. Treatment depends on the severity of your condition, and it may include: Wearing compression stockings. These can help relieve symptoms and help prevent your condition from getting worse. However, they do not cure the condition. Sclerotherapy. This procedure involves an injection of a solution that shrinks damaged veins. Surgery. This may involve: Removing a diseased vein (vein stripping). Cutting off blood flow through the vein (laser ablation surgery). Repairing or reconstructing a valve within the affected vein. Follow these instructions at home:   Wear compression stockings as told by your health care provider. These stockings help to prevent blood clots and reduce swelling in your legs. Take over-the-counter and prescription medicines only as told by your health care provider. Stay active by exercising, walking, or doing different activities. Ask your health care provider what activities are safe for you and how much exercise you need. Drink enough fluid to keep your urine pale yellow. Do not use any products that contain nicotine or tobacco, such as cigarettes, e-cigarettes, and chewing tobacco. If you need help quitting, ask your health care provider. Keep all follow-up visits as told by your health care provider. This is important. Contact a health care provider if you: Have redness, swelling, or more pain in the affected area. See a red streak or line that goes up or down from the affected area. Have skin breakdown or skin loss in the affected area, even if the breakdown is small. Get an injury   in the affected area. Get help right away if: You get an injury and an open wound in the affected area. You have: Severe pain that does not get better with  medicine. Sudden numbness or weakness in the foot or ankle below the affected area. Trouble moving your foot or ankle. A fever. Worse or persistent symptoms. Chest pain. Shortness of breath. Summary Chronic venous insufficiency is a condition where the leg veins cannot effectively pump blood from the legs to the heart. Chronic venous insufficiency occurs when the vein walls become stretched, weakened, or damaged, or when valves within the vein are damaged. Treatment depends on how severe your condition is. It often involves wearing compression stockings and may involve having a procedure. Make sure you stay active by exercising, walking, or doing different activities. Ask your health care provider what activities are safe for you and how much exercise you need. This information is not intended to replace advice given to you by your health care provider. Make sure you discuss any questions you have with your health care provider. Document Revised: 11/08/2020 Document Reviewed: 11/08/2020 Elsevier Patient Education  2022 [employer].  

## 2021-06-07 NOTE — Assessment & Plan Note (Signed)
Resulting in chronic intermittent edema.  Patient already taking diuretic. Advised to keep legs elevated when sitting down.  Low-salt diet. Compression socks as recommended. Blood work done today.

## 2021-06-07 NOTE — Assessment & Plan Note (Signed)
Well-controlled hypertension.  Continue Zestoretic 20-25 mg daily and nebivolol 2.5 mg daily. BP Readings from Last 3 Encounters:  06/07/21 134/68  11/08/20 122/78  08/25/20 (!) 162/78  Dietary approaches to stop hypertension discussed. Recommended low-salt diet. Blood work done today. Follow-up with Dr. Okey Dupre in the next several weeks.

## 2021-07-18 ENCOUNTER — Other Ambulatory Visit: Payer: Self-pay | Admitting: Internal Medicine

## 2021-07-18 DIAGNOSIS — I10 Essential (primary) hypertension: Secondary | ICD-10-CM

## 2021-07-27 ENCOUNTER — Other Ambulatory Visit: Payer: Self-pay | Admitting: Internal Medicine

## 2021-07-31 ENCOUNTER — Other Ambulatory Visit: Payer: Self-pay

## 2021-07-31 ENCOUNTER — Encounter: Payer: Self-pay | Admitting: Internal Medicine

## 2021-07-31 ENCOUNTER — Ambulatory Visit (INDEPENDENT_AMBULATORY_CARE_PROVIDER_SITE_OTHER): Payer: Medicare HMO | Admitting: Internal Medicine

## 2021-07-31 VITALS — BP 126/62 | HR 96 | Resp 18 | Ht 67.0 in | Wt 153.4 lb

## 2021-07-31 DIAGNOSIS — E1169 Type 2 diabetes mellitus with other specified complication: Secondary | ICD-10-CM | POA: Diagnosis not present

## 2021-07-31 DIAGNOSIS — I10 Essential (primary) hypertension: Secondary | ICD-10-CM

## 2021-07-31 DIAGNOSIS — R55 Syncope and collapse: Secondary | ICD-10-CM | POA: Diagnosis not present

## 2021-07-31 LAB — CBC
HCT: 40.9 % (ref 39.0–52.0)
Hemoglobin: 13.4 g/dL (ref 13.0–17.0)
MCHC: 32.8 g/dL (ref 30.0–36.0)
MCV: 88.2 fl (ref 78.0–100.0)
Platelets: 163 10*3/uL (ref 150.0–400.0)
RBC: 4.64 Mil/uL (ref 4.22–5.81)
RDW: 13.6 % (ref 11.5–15.5)
WBC: 6.8 10*3/uL (ref 4.0–10.5)

## 2021-07-31 LAB — VITAMIN B12: Vitamin B-12: 1228 pg/mL — ABNORMAL HIGH (ref 211–911)

## 2021-07-31 LAB — VITAMIN D 25 HYDROXY (VIT D DEFICIENCY, FRACTURES): VITD: 27.55 ng/mL — ABNORMAL LOW (ref 30.00–100.00)

## 2021-07-31 LAB — TSH: TSH: 1.96 u[IU]/mL (ref 0.35–5.50)

## 2021-07-31 MED ORDER — FREESTYLE LIBRE 2 READER DEVI: 0 refills | Status: DC

## 2021-07-31 MED ORDER — FREESTYLE LIBRE 2 SENSOR MISC: 11 refills | Status: DC

## 2021-07-31 MED ORDER — FREESTYLE LIBRE 3 SENSOR MISC: 1.0000 | Freq: Every day | 11 refills | Status: DC

## 2021-07-31 NOTE — Progress Notes (Signed)
   Subjective:   Patient ID: Richard Lucas, male    DOB: Oct 15, 1950, 70 y.o.   MRN: [id number]  HPI The patient is a 70 YO man coming in for ongoing concerns as well as 2 new syncope episodes recently. Daughter present and helps to provide history.   Review of Systems  Constitutional: Negative.   HENT: Negative.    Eyes: Negative.   Respiratory:  Negative for cough, chest tightness and shortness of breath.   Cardiovascular:  Negative for chest pain, palpitations and leg swelling.  Gastrointestinal:  Negative for abdominal distention, abdominal pain, constipation, diarrhea, nausea and vomiting.  Musculoskeletal: Negative.   Skin: Negative.   Neurological:  Positive for syncope.  Psychiatric/Behavioral: Negative.     Objective:  Physical Exam Constitutional:      Appearance: He is well-developed.  HENT:     Head: Normocephalic and atraumatic.  Cardiovascular:     Rate and Rhythm: Normal rate and regular rhythm.  Pulmonary:     Effort: Pulmonary effort is normal. No respiratory distress.     Breath sounds: Normal breath sounds. No wheezing or rales.  Abdominal:     General: Bowel sounds are normal. There is no distension.     Palpations: Abdomen is soft.     Tenderness: There is no abdominal tenderness. There is no rebound.  Musculoskeletal:     Cervical back: Normal range of motion.  Skin:    General: Skin is warm and dry.  Neurological:     Mental Status: He is alert and oriented to person, place, and time.     Coordination: Coordination normal.    Vitals:   07/31/21 1306  BP: 126/62  Pulse: 96  Resp: 18  SpO2: 96%  Weight: 153 lb 6.4 oz (69.6 kg)  Height: 5\' 7"  (1.702 m)   EKG: Rate 78, axis normal, interval 1st degree AV block, sinus, no st or t wave changes, no significant change compared to prior 2021   This visit occurred during the SARS-CoV-2 public health emergency.  Safety protocols were in place, including screening questions prior to the visit,  additional usage of staff PPE, and extensive cleaning of exam room while observing appropriate contact time as indicated for disinfecting solutions.   Assessment & Plan:

## 2021-07-31 NOTE — Patient Instructions (Signed)
The EKG of the heart looks the same. We are checking the labs today and will call you back about the results.

## 2021-08-01 LAB — COMPREHENSIVE METABOLIC PANEL
ALT: 41 U/L (ref 0–53)
AST: 23 U/L (ref 0–37)
Albumin: 4.5 g/dL (ref 3.5–5.2)
Alkaline Phosphatase: 51 U/L (ref 39–117)
BUN: 14 mg/dL (ref 6–23)
CO2: 28 mEq/L (ref 19–32)
Calcium: 9.8 mg/dL (ref 8.4–10.5)
Chloride: 101 mEq/L (ref 96–112)
Creatinine, Ser: 1.12 mg/dL (ref 0.40–1.50)
GFR: 65.55 mL/min (ref >60.00)
Glucose, Bld: 154 mg/dL — ABNORMAL HIGH (ref 70–99)
Potassium: 4.5 mEq/L (ref 3.5–5.1)
Sodium: 140 mEq/L (ref 135–145)
Total Bilirubin: 0.7 mg/dL (ref 0.2–1.2)
Total Protein: 7.6 g/dL (ref 6.0–8.3)

## 2021-08-02 ENCOUNTER — Encounter: Payer: Self-pay | Admitting: Internal Medicine

## 2021-08-02 MED ORDER — GLIPIZIDE 5 MG PO TABS: 5.0000 mg | ORAL_TABLET | Freq: Every day | ORAL | 3 refills | Status: DC

## 2021-08-02 NOTE — Assessment & Plan Note (Signed)
No clear trigger for this. It is unclear if he is eating and drinking enough. He admits to poor appetite. Checking vitamin D and B12 as well as CMP and CBC today. We are reducing his diabetes medications as he is having some intermittent low sugars although these have not been clearly associated with 2 syncope episodes. EKG done today which is not changed from before. Encouraged more fluids to see if this helps.

## 2021-08-02 NOTE — Assessment & Plan Note (Signed)
BP is normal today and no low values reported at home. Will keep lisinopril/hctz 20/25 mg daily and bystolic 2.5 mg daily. Checking CMP and CBC and adjust as needed.

## 2021-08-02 NOTE — Assessment & Plan Note (Signed)
I have concerns that this is over treated with recent HgA1c 5.3. He is having low sugar episodes which are happening more often. Offered either D/C glipizide or at minimum reduce dosing to 5 mg daily and they elected this. Reduce glipizide dosing to 5 mg daily and keep metformin 500 mg BID. Needs close follow up 3 months and then perhaps further adjustment.

## 2021-08-21 ENCOUNTER — Other Ambulatory Visit: Payer: Self-pay

## 2021-08-21 ENCOUNTER — Ambulatory Visit (INDEPENDENT_AMBULATORY_CARE_PROVIDER_SITE_OTHER): Payer: Medicare HMO | Admitting: Internal Medicine

## 2021-08-21 ENCOUNTER — Encounter: Payer: Self-pay | Admitting: Internal Medicine

## 2021-08-21 VITALS — BP 138/70 | HR 74 | Temp 98.0°F | Ht 67.0 in | Wt 158.0 lb

## 2021-08-21 DIAGNOSIS — E1169 Type 2 diabetes mellitus with other specified complication: Secondary | ICD-10-CM

## 2021-08-21 DIAGNOSIS — R55 Syncope and collapse: Secondary | ICD-10-CM

## 2021-08-21 DIAGNOSIS — I10 Essential (primary) hypertension: Secondary | ICD-10-CM

## 2021-08-21 MED ORDER — TIZANIDINE HCL 2 MG PO TABS: 2.0000 mg | ORAL_TABLET | Freq: Three times a day (TID) | ORAL | 0 refills | Status: DC | PRN

## 2021-08-21 MED ORDER — IBUPROFEN 400 MG PO TABS: 400.0000 mg | ORAL_TABLET | Freq: Four times a day (QID) | ORAL | 0 refills | Status: AC | PRN
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
## 2021-08-21 NOTE — Patient Instructions (Signed)
Your exam today is very good  Please take all new medication as prescribed - the anti-inflammatory and muscle relaxer only as needed, such as tomorrow when you expect the stiffness and soreness to get worse  Please continue all other medications as before, and refills have been done if requested.  Please have the pharmacy call with any other refills you may need.  Please continue your efforts at being more active, low cholesterol diet, and weight control.  Please keep your appointments with your specialists as you may have planned

## 2021-08-23 ENCOUNTER — Encounter: Payer: Self-pay | Admitting: Internal Medicine
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
## 2021-08-23 NOTE — Assessment & Plan Note (Addendum)
BP Readings from Last 3 Encounters:  08/21/21 138/70  07/31/21 126/62  06/07/21 134/68   Stable, pt to continue medical treatment zestoretic, bystolic

## 2021-08-23 NOTE — Assessment & Plan Note (Signed)
Lab Results  Component Value Date   HGBA1C 5.3 06/07/2021   Stable, pt to continue current medical treatment glipizide, metformin

## 2021-08-23 NOTE — Assessment & Plan Note (Signed)
Pt denies recurrent symptoms, declines ecg or lab testing today,  to f/u any worsening symptoms or concerns

## 2021-09-28 ENCOUNTER — Ambulatory Visit: Payer: Medicare HMO | Admitting: Internal Medicine

## 2021-09-28 ENCOUNTER — Telehealth: Payer: Self-pay | Admitting: Internal Medicine

## 2021-09-28 NOTE — Telephone Encounter (Signed)
N/A unable to leave a message for patient to call back to schedule Medicare Annual Wellness Visit   Last AWV  08/25/20  Please schedule at anytime with [facility] Advisor if patient calls the office back.    40 Minutes appointment   Any questions, please call me at [phone]

## 2021-12-06 ENCOUNTER — Other Ambulatory Visit: Payer: Self-pay | Admitting: Internal Medicine

## 2021-12-06 DIAGNOSIS — I10 Essential (primary) hypertension: Secondary | ICD-10-CM

## 2022-01-12 ENCOUNTER — Encounter: Payer: Self-pay | Admitting: Internal Medicine

## 2022-01-12 ENCOUNTER — Ambulatory Visit (INDEPENDENT_AMBULATORY_CARE_PROVIDER_SITE_OTHER): Payer: Medicare HMO | Admitting: Internal Medicine

## 2022-01-12 VITALS — BP 120/78 | HR 89 | Resp 18 | Ht 67.0 in | Wt 158.4 lb

## 2022-01-12 DIAGNOSIS — E785 Hyperlipidemia, unspecified: Secondary | ICD-10-CM | POA: Diagnosis not present

## 2022-01-12 DIAGNOSIS — Z0000 Encounter for general adult medical examination without abnormal findings: Secondary | ICD-10-CM

## 2022-01-12 DIAGNOSIS — E1169 Type 2 diabetes mellitus with other specified complication: Secondary | ICD-10-CM | POA: Insufficient documentation

## 2022-01-12 DIAGNOSIS — I10 Essential (primary) hypertension: Secondary | ICD-10-CM

## 2022-01-12 LAB — CBC
HCT: 39.3 % (ref 39.0–52.0)
Hemoglobin: 13.4 g/dL (ref 13.0–17.0)
MCHC: 34.1 g/dL (ref 30.0–36.0)
MCV: 85.9 fl (ref 78.0–100.0)
Platelets: 160 10*3/uL (ref 150.0–400.0)
RBC: 4.57 Mil/uL (ref 4.22–5.81)
RDW: 14.6 % (ref 11.5–15.5)
WBC: 6.3 10*3/uL (ref 4.0–10.5)

## 2022-01-12 LAB — LIPID PANEL
Cholesterol: 169 mg/dL (ref 0–200)
HDL: 38.6 mg/dL — ABNORMAL LOW (ref >39.00)
Total CHOL/HDL Ratio: 4
Triglycerides: 422 mg/dL — ABNORMAL HIGH (ref 0.0–149.0)

## 2022-01-12 LAB — COMPREHENSIVE METABOLIC PANEL
ALT: 33 U/L (ref 0–53)
AST: 31 U/L (ref 0–37)
Albumin: 4.5 g/dL (ref 3.5–5.2)
Alkaline Phosphatase: 46 U/L (ref 39–117)
BUN: 25 mg/dL — ABNORMAL HIGH (ref 6–23)
CO2: 30 mEq/L (ref 19–32)
Calcium: 9.6 mg/dL (ref 8.4–10.5)
Chloride: 99 mEq/L (ref 96–112)
Creatinine, Ser: 1.23 mg/dL (ref 0.40–1.50)
GFR: 58.39 mL/min — ABNORMAL LOW (ref >60.00)
Glucose, Bld: 155 mg/dL — ABNORMAL HIGH (ref 70–99)
Potassium: 3.6 mEq/L (ref 3.5–5.1)
Sodium: 139 mEq/L (ref 135–145)
Total Bilirubin: 0.4 mg/dL (ref 0.2–1.2)
Total Protein: 7.6 g/dL (ref 6.0–8.3)

## 2022-01-12 LAB — MICROALBUMIN / CREATININE URINE RATIO
Creatinine,U: 60.2 mg/dL
Microalb Creat Ratio: 1.2 mg/g (ref 0.0–30.0)
Microalb, Ur: <0.7 mg/dL (ref 0.0–1.9)

## 2022-01-12 LAB — LDL CHOLESTEROL, DIRECT: Direct LDL: 108 mg/dL

## 2022-01-12 LAB — HEMOGLOBIN A1C: Hgb A1c MFr Bld: 5.8 % (ref 4.6–6.5)

## 2022-01-12 MED ORDER — SIMVASTATIN 20 MG PO TABS: 20.0000 mg | ORAL_TABLET | Freq: Every day | ORAL | 3 refills | Status: DC

## 2022-01-12 MED ORDER — TRIAMCINOLONE ACETONIDE 0.1 % EX OINT
TOPICAL_OINTMENT | Freq: Two times a day (BID) | CUTANEOUS | 3 refills | Status: DC
Start: 2022-01-12 — End: 2023-11-15

## 2022-01-12 NOTE — Progress Notes (Signed)
? ?Subjective:  ? ?Patient ID: Richard Lucas, male    DOB: Oct 08, 1950, 71 y.o.   MRN: [id number] ? ?HPI ?Here for medicare wellness and physical, no new complaints. Please see A/P for status and treatment of chronic medical problems.  ? ?Diet: DM since diabetic ?Physical activity: sedentary, walks ?Depression/mood screen: negative ?Hearing: intact to whispered voice ?Visual acuity: grossly normal with lens, overdue for annual eye exam  ?ADLs: capable ?Fall risk: none ?Home safety: good ?Cognitive evaluation: intact to orientation, naming, recall and repetition ?EOL planning: adv directives discussed, not in place ? ?Flowsheet Row Office Visit from 01/12/2022 in [facility] [place]  ?PHQ-2 Total Score 0  ? ?  ?  ?Flowsheet Row Office Visit from 01/12/2022 in [facility]  ?PHQ-9 Total Score 1  ? ?  ? ? ?  06/30/2018  ?  4:10 PM 08/25/2019  ?  2:21 PM 07/04/2020  ?  2:00 PM 06/07/2021  ?  1:10 PM 01/12/2022  ?  1:21 PM  ?Fall Risk  ?Falls in the past year? No 0 1 0 0  ?Was there an injury with Fall?   0 0 0  ?Fall Risk Category Calculator   1 0 0  ?Fall Risk Category   Low Low Low  ?Patient Fall Risk Level  Low fall risk Low fall risk    ?Patient at Risk for Falls Due to   History of fall(s)    ?Fall risk Follow up   Falls evaluation completed    ? ? ?I have personally reviewed and have noted ?1. The patient's medical and social history - reviewed today no changes ?2. Their use of alcohol, tobacco or illicit drugs ?3. Their current medications and supplements ?4. The patient's functional ability including ADL's, fall risks, home safety risks and hearing or visual impairment. ?5. Diet and physical activities ?6. Evidence for depression or mood disorders ?7. Care team reviewed and updated ?8.  The patient is not on an opioid pain medication. ? ?Patient Care Team: ?Myrlene Broker, MD as PCP - General (Internal Medicine) ?Past Medical History:  ?Diagnosis Date  ? Hypertension    ? ?History reviewed. No pertinent surgical history. ?Family History  ?Problem Relation Age of Onset  ? Hypertension Mother   ? Hypertension Father   ? Hypertension Sister   ? Hypertension Brother   ? ?Review of Systems  ?Constitutional: Negative.   ?HENT: Negative.    ?Eyes: Negative.   ?Respiratory:  Negative for cough, chest tightness and shortness of breath.   ?Cardiovascular:  Negative for chest pain, palpitations and leg swelling.  ?Gastrointestinal:  Negative for abdominal distention, abdominal pain, constipation, diarrhea, nausea and vomiting.  ?Musculoskeletal: Negative.   ?Skin: Negative.   ?Neurological: Negative.   ?Psychiatric/Behavioral: Negative.    ? ?Objective:  ?Physical Exam ?Constitutional:   ?   Appearance: He is well-developed.  ?HENT:  ?   Head: Normocephalic and atraumatic.  ?Cardiovascular:  ?   Rate and Rhythm: Normal rate and regular rhythm.  ?Pulmonary:  ?   Effort: Pulmonary effort is normal. No respiratory distress.  ?   Breath sounds: Normal breath sounds. No wheezing or rales.  ?Abdominal:  ?   General: Bowel sounds are normal. There is no distension.  ?   Palpations: Abdomen is soft.  ?   Tenderness: There is no abdominal tenderness. There is no rebound.  ?Musculoskeletal:  ?   Cervical back: Normal range of motion.  ?Skin: ?  General: Skin is warm and dry.  ?   Comments: Foot exam done, chronic skin changes on the legs  ?Neurological:  ?   Mental Status: He is alert and oriented to person, place, and time.  ?   Coordination: Coordination normal.  ? ? ?Vitals:  ? 01/12/22 1318  ?BP: 120/78  ?Pulse: 89  ?Resp: 18  ?SpO2: 98%  ?Weight: 158 lb 6.4 oz (71.8 kg)  ?Height: 5\' 7"  (1.702 m)  ? ?This visit occurred during the SARS-CoV-2 public health emergency.  Safety protocols were in place, including screening questions prior to the visit, additional usage of staff PPE, and extensive cleaning of exam room while observing appropriate contact time as indicated for disinfecting solutions.   ? ?Assessment & Plan:  ? ?
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
## 2022-01-12 NOTE — Assessment & Plan Note (Signed)
Foot exam done and checking HgA1c and microalbumin to creatinine ratio. Taking metformin 500 mg BID and glipizide 5 mg daily. Depending on HgA1c adjust. Is on ACE-I and statin. Checking lipid panel.  ?

## 2022-01-12 NOTE — Assessment & Plan Note (Signed)
Taking simvastatin 20 mg daily and checking lipid panel and adjust as needed.  

## 2022-01-12 NOTE — Patient Instructions (Signed)
Ask the pharmacy if you got the shingles vaccine. ? ? ?

## 2022-01-12 NOTE — Assessment & Plan Note (Signed)
Taking lisinopril/hctz 20/25 mg daily and checking CMP. Adjust as needed.  ?

## 2022-01-22 ENCOUNTER — Other Ambulatory Visit: Payer: Self-pay | Admitting: Internal Medicine

## 2022-01-23 ENCOUNTER — Telehealth: Payer: Self-pay

## 2022-01-23 NOTE — Telephone Encounter (Signed)
Refilled via rx refill request.  ?

## 2022-01-23 NOTE — Telephone Encounter (Signed)
Pt is requesting a refill: ?Continuous Blood Gluc Sensor (FREESTYLE LIBRE 3 SENSOR) MISC ? ?Pharmacy: ?[facility] [id number] - [place], [place] ? ?LOV 01/12/22 ?

## 2022-02-04 ENCOUNTER — Other Ambulatory Visit: Payer: Self-pay | Admitting: Internal Medicine

## 2022-02-04 DIAGNOSIS — I10 Essential (primary) hypertension: Secondary | ICD-10-CM

## 2022-02-18 ENCOUNTER — Other Ambulatory Visit: Payer: Self-pay | Admitting: Internal Medicine

## 2022-03-09 ENCOUNTER — Encounter: Payer: Self-pay | Admitting: Internal Medicine

## 2022-03-09 ENCOUNTER — Other Ambulatory Visit: Payer: Self-pay | Admitting: Internal Medicine

## 2022-03-09 ENCOUNTER — Ambulatory Visit (INDEPENDENT_AMBULATORY_CARE_PROVIDER_SITE_OTHER): Payer: Medicare HMO | Admitting: Internal Medicine

## 2022-03-09 DIAGNOSIS — E1169 Type 2 diabetes mellitus with other specified complication: Secondary | ICD-10-CM

## 2022-03-09 DIAGNOSIS — E785 Hyperlipidemia, unspecified: Secondary | ICD-10-CM

## 2022-03-09 MED ORDER — FREESTYLE LIBRE 3 SENSOR MISC: 11 refills | Status: DC

## 2022-03-09 MED ORDER — ROSUVASTATIN CALCIUM 10 MG PO TABS: 10.0000 mg | ORAL_TABLET | Freq: Every day | ORAL | 3 refills | Status: DC

## 2022-03-09 MED ORDER — FREESTYLE LIBRE 3 SENSOR MISC
11 refills | Status: DC
Start: 2022-03-09 — End: 2022-08-13

## 2022-03-09 NOTE — Assessment & Plan Note (Signed)
Not taking simvastatin due to side effects. Will D/C and prescribe crestor 10 mg daily which he is willing to take. Will have him return in 3-6 months for follow up with labs.

## 2022-03-09 NOTE — Progress Notes (Signed)
   Subjective:   Patient ID: Richard Lucas, male    DOB: May 27, 1951, 71 y.o.   MRN: [id number]  HPI The patient is a 71 YO man coming in for follow up.   Review of Systems  Constitutional: Negative.   HENT: Negative.    Eyes: Negative.   Respiratory:  Negative for cough, chest tightness and shortness of breath.   Cardiovascular:  Negative for chest pain, palpitations and leg swelling.  Gastrointestinal:  Negative for abdominal distention, abdominal pain, constipation, diarrhea, nausea and vomiting.  Musculoskeletal: Negative.   Skin: Negative.   Neurological: Negative.   Psychiatric/Behavioral: Negative.      Objective:  Physical Exam Constitutional:      Appearance: He is well-developed.  HENT:     Head: Normocephalic and atraumatic.  Cardiovascular:     Rate and Rhythm: Normal rate and regular rhythm.  Pulmonary:     Effort: Pulmonary effort is normal. No respiratory distress.     Breath sounds: Normal breath sounds. No wheezing or rales.  Abdominal:     General: Bowel sounds are normal. There is no distension.     Palpations: Abdomen is soft.     Tenderness: There is no abdominal tenderness. There is no rebound.  Musculoskeletal:     Cervical back: Normal range of motion.  Skin:    General: Skin is warm and dry.  Neurological:     Mental Status: He is alert and oriented to person, place, and time.     Coordination: Coordination normal.     Vitals:   03/09/22 1535  BP: 124/60  Pulse: 70  Resp: 18  SpO2: 94%  Weight: 158 lb 9.6 oz (71.9 kg)  Height: 5\' 7"  (1.702 m)    Assessment & Plan:

## 2022-03-09 NOTE — Patient Instructions (Addendum)
We will have you stop the simvastatin. We have sent in rosuvastatin instead to take 1 pill daily. It is okay to wait 2-3 weeks after stopping before you start the new medicine.

## 2022-03-14 LAB — HM DIABETES EYE EXAM

## 2022-05-05 ENCOUNTER — Other Ambulatory Visit: Payer: Self-pay | Admitting: Internal Medicine

## 2022-05-05 DIAGNOSIS — I10 Essential (primary) hypertension: Secondary | ICD-10-CM

## 2022-05-08 ENCOUNTER — Other Ambulatory Visit: Payer: Self-pay | Admitting: Internal Medicine

## 2022-05-08 DIAGNOSIS — I10 Essential (primary) hypertension: Secondary | ICD-10-CM

## 2022-05-15 NOTE — Telephone Encounter (Signed)
The original prescription was reordered on 05/10/2022 by Barbara Cower, CMA.

## 2022-06-22 ENCOUNTER — Encounter: Payer: Self-pay | Admitting: Internal Medicine

## 2022-06-22 ENCOUNTER — Ambulatory Visit (INDEPENDENT_AMBULATORY_CARE_PROVIDER_SITE_OTHER): Payer: Medicare HMO | Admitting: Internal Medicine

## 2022-06-22 VITALS — BP 130/74 | HR 81 | Temp 98.0°F | Ht 67.0 in | Wt 158.0 lb

## 2022-06-22 DIAGNOSIS — E1169 Type 2 diabetes mellitus with other specified complication: Secondary | ICD-10-CM | POA: Diagnosis not present

## 2022-06-22 DIAGNOSIS — E785 Hyperlipidemia, unspecified: Secondary | ICD-10-CM | POA: Diagnosis not present

## 2022-06-22 LAB — CBC
HCT: 40.7 % (ref 39.0–52.0)
Hemoglobin: 14 g/dL (ref 13.0–17.0)
MCHC: 34.4 g/dL (ref 30.0–36.0)
MCV: 85.2 fl (ref 78.0–100.0)
Platelets: 132 10*3/uL — ABNORMAL LOW (ref 150.0–400.0)
RBC: 4.78 Mil/uL (ref 4.22–5.81)
RDW: 15 % (ref 11.5–15.5)
WBC: 5.9 10*3/uL (ref 4.0–10.5)

## 2022-06-22 LAB — COMPREHENSIVE METABOLIC PANEL
ALT: 30 U/L (ref 0–53)
AST: 29 U/L (ref 0–37)
Albumin: 4.7 g/dL (ref 3.5–5.2)
Alkaline Phosphatase: 45 U/L (ref 39–117)
BUN: 18 mg/dL (ref 6–23)
CO2: 32 mEq/L (ref 19–32)
Calcium: 10.2 mg/dL (ref 8.4–10.5)
Chloride: 98 mEq/L (ref 96–112)
Creatinine, Ser: 1.38 mg/dL (ref 0.40–1.50)
GFR: 50.7 mL/min — ABNORMAL LOW (ref >60.00)
Glucose, Bld: 111 mg/dL — ABNORMAL HIGH (ref 70–99)
Potassium: 3.7 mEq/L (ref 3.5–5.1)
Sodium: 138 mEq/L (ref 135–145)
Total Bilirubin: 1 mg/dL (ref 0.2–1.2)
Total Protein: 8 g/dL (ref 6.0–8.3)

## 2022-06-22 LAB — LDL CHOLESTEROL, DIRECT: Direct LDL: 103 mg/dL

## 2022-06-22 LAB — LIPID PANEL
Cholesterol: 161 mg/dL (ref 0–200)
HDL: 44 mg/dL (ref >39.00)
NonHDL: 116.98
Total CHOL/HDL Ratio: 4
Triglycerides: 228 mg/dL — ABNORMAL HIGH (ref 0.0–149.0)
VLDL: 45.6 mg/dL — ABNORMAL HIGH (ref 0.0–40.0)

## 2022-06-22 LAB — HEMOGLOBIN A1C: Hgb A1c MFr Bld: 6.2 % (ref 4.6–6.5)

## 2022-06-22 NOTE — Assessment & Plan Note (Signed)
Checking Hga1c and adjust as needed. Taking metformin 500 mg BID and glipizide 5 mg daily. On ACE-I and statin.

## 2022-06-22 NOTE — Assessment & Plan Note (Signed)
Change from simvastatin to crestor 10 mg daily about 3 months ago. Checking CMP and lipid panel today and adjust dosing as needed for LDL goal <100.

## 2022-06-22 NOTE — Patient Instructions (Addendum)
We will check the labs today. 

## 2022-06-22 NOTE — Progress Notes (Signed)
   Subjective:   Patient ID: Richard Lucas, male    DOB: 09/15/1950, 71 y.o.   MRN: [id number]  HPI The patient is a 72 YO man coming in for follow up.  Review of Systems  Constitutional: Negative.   HENT: Negative.    Eyes: Negative.   Respiratory:  Negative for cough, chest tightness and shortness of breath.   Cardiovascular:  Negative for chest pain, palpitations and leg swelling.  Gastrointestinal:  Negative for abdominal distention, abdominal pain, constipation, diarrhea, nausea and vomiting.  Musculoskeletal: Negative.   Skin: Negative.   Neurological: Negative.   Psychiatric/Behavioral: Negative.      Objective:  Physical Exam Constitutional:      Appearance: He is well-developed.  HENT:     Head: Normocephalic and atraumatic.  Cardiovascular:     Rate and Rhythm: Normal rate and regular rhythm.  Pulmonary:     Effort: Pulmonary effort is normal. No respiratory distress.     Breath sounds: Normal breath sounds. No wheezing or rales.  Abdominal:     General: Bowel sounds are normal. There is no distension.     Palpations: Abdomen is soft.     Tenderness: There is no abdominal tenderness. There is no rebound.  Musculoskeletal:     Cervical back: Normal range of motion.  Skin:    General: Skin is warm and dry.  Neurological:     Mental Status: He is alert and oriented to person, place, and time.     Coordination: Coordination normal.     Vitals:   06/22/22 1055  BP: 130/74  Pulse: 81  Temp: 98 F (36.7 C)  TempSrc: Oral  SpO2: 94%  Weight: 158 lb (71.7 kg)  Height: 5\' 7"  (1.702 m)    Assessment & Plan:

## 2022-07-27 ENCOUNTER — Other Ambulatory Visit: Payer: Self-pay | Admitting: Internal Medicine

## 2022-07-27 DIAGNOSIS — I10 Essential (primary) hypertension: Secondary | ICD-10-CM

## 2022-08-03 ENCOUNTER — Other Ambulatory Visit: Payer: Self-pay | Admitting: Internal Medicine

## 2022-08-08 ENCOUNTER — Ambulatory Visit (HOSPITAL_COMMUNITY)
Admission: EM | Admit: 2022-08-08 | Discharge: 2022-08-08 | Disposition: A | Discharge status: Home / Self Care | Payer: Medicare HMO | Attending: Internal Medicine | Admitting: Internal Medicine

## 2022-08-08 ENCOUNTER — Encounter (HOSPITAL_COMMUNITY): Payer: Self-pay

## 2022-08-08 DIAGNOSIS — R55 Syncope and collapse: Secondary | ICD-10-CM | POA: Insufficient documentation

## 2022-08-08 DIAGNOSIS — E1169 Type 2 diabetes mellitus with other specified complication: Secondary | ICD-10-CM | POA: Insufficient documentation

## 2022-08-08 LAB — POCT URINALYSIS DIPSTICK, ED / UC
Bilirubin Urine: NEGATIVE
Glucose, UA: NEGATIVE mg/dL
Hgb urine dipstick: NEGATIVE
Ketones, ur: NEGATIVE mg/dL
Leukocytes,Ua: NEGATIVE
Nitrite: NEGATIVE
Protein, ur: NEGATIVE mg/dL
Specific Gravity, Urine: 1.015 (ref 1.005–1.030)
Urobilinogen, UA: 0.2 mg/dL (ref 0.0–1.0)
pH: 5.5 (ref 5.0–8.0)

## 2022-08-08 LAB — CBC
HCT: 43 % (ref 39.0–52.0)
Hemoglobin: 14.3 g/dL (ref 13.0–17.0)
MCH: 28.6 pg (ref 26.0–34.0)
MCHC: 33.3 g/dL (ref 30.0–36.0)
MCV: 86 fL (ref 80.0–100.0)
Platelets: 152 10*3/uL (ref 150–400)
RBC: 5 MIL/uL (ref 4.22–5.81)
RDW: 13.7 % (ref 11.5–15.5)
WBC: 6.5 10*3/uL (ref 4.0–10.5)
nRBC: 0 % (ref 0.0–0.2)

## 2022-08-08 LAB — BASIC METABOLIC PANEL
Anion gap: 11 (ref 5–15)
BUN: 18 mg/dL (ref 8–23)
CO2: 27 mmol/L (ref 22–32)
Calcium: 9.7 mg/dL (ref 8.9–10.3)
Chloride: 98 mmol/L (ref 98–111)
Creatinine, Ser: 1.21 mg/dL (ref 0.61–1.24)
GFR, Estimated: >60 mL/min (ref >60)
Glucose, Bld: 144 mg/dL — ABNORMAL HIGH (ref 70–99)
Potassium: 3.1 mmol/L — ABNORMAL LOW (ref 3.5–5.1)
Sodium: 136 mmol/L (ref 135–145)

## 2022-08-08 LAB — CBG MONITORING, ED: Glucose-Capillary: 104 mg/dL — ABNORMAL HIGH (ref 70–99)

## 2022-08-08 NOTE — Discharge Instructions (Signed)
Keep taking your medications as prescribed.  I have drawn some blood work today to assess for level and electrolyte abnormality.  We will call you with any abnormal results.  Drink plenty of water, increase water intake to at least 64 ounces of water per day to stay well-hydrated.  Follow-up with your primary care provider on Monday as discussed and as scheduled.   If you develop any new or worsening symptoms or do not improve in the next 2 to 3 days, please return.  If your symptoms are severe, please go to the emergency room.  Follow-up with your primary care provider for further evaluation and management of your symptoms as well as ongoing wellness visits.  I hope you feel better!

## 2022-08-08 NOTE — ED Provider Notes (Signed)
[place]    CSN: [id number] Arrival date & time: 08/08/22  1217      History   Chief Complaint Chief Complaint  Patient presents with   Loss of Consciousness   Hyperglycemia    HPI Richard Lucas is a 71 y.o. male.   Patient presents to urgent care for evaluation after syncopal episode last night while at home in his chair. He states he became nauseous and dizzy upon standing last night around 8 PM.  He sat back down and lost consciousness.  Patient did not fall or hit his head prior to syncopal episode.  His daughter found him sitting in a chair and she states that she "shook him awake to revive him".  Daughter states patient never stopped breathing during the syncopal episode and had a very high blood sugar around 200-300 after the episode.  He is a type II diabetic and had not eaten dinner prior to syncopal episode.  He remembers waking up to his daughter and experiencing bit of a headache that subsided after short while.  He wanted to go to work after the syncopal episode but daughter told him this was not a good idea and so patient stayed home to recover.  Today, he denies dizziness, nausea, vomiting, recent/past head injury, blurry vision, tremor, urinary symptoms, abdominal pain, flank pain, fever/chills, URI symptoms, irregular heartbeat, chest pain, shortness of breath, or weakness.  He denies preceding chest pain, shortness of breath, and weakness prior to passing out as well.  States this has happened in the past but that it has been a long time since last syncopal episode.  Patient takes metformin for type 2 diabetes and took himself off of his glipizide.  He takes lisinopril-HCTZ for blood pressure and had taken all of his medications as scheduled prior to syncopal episode.  No seizure activity or postictal symptoms after returning to consciousness.   Loss of Consciousness Hyperglycemia Associated symptoms: syncope     Past Medical History:  Diagnosis Date    Hypertension     Patient Active Problem List   Diagnosis Date Noted   Hyperlipidemia associated with type 2 diabetes mellitus ([place]) 01/12/2022   Chronic venous insufficiency 06/07/2021   Diabetes ([place]) 07/10/2020   Syncope 07/04/2020   Routine general medical examination at a health care facility 06/18/2017   Erectile dysfunction 12/19/2015   Essential hypertension 07/26/2014    History reviewed. No pertinent surgical history.     Home Medications    Prior to Admission medications   Medication Sig Start Date End Date Taking? Authorizing Provider  acetaminophen (TYLENOL) 500 MG tablet Take 2 tablets (1,000 mg total) by mouth every 6 (six) hours as needed. 11/04/17  Yes Charlesetta Shanks, MD  alfuzosin (UROXATRAL) 10 MG 24 hr tablet Take 10 mg by mouth at bedtime. 05/26/20  Yes [provider]  Ascorbic Acid (VITAMIN C PO) Take 1 tablet by mouth daily.   Yes [provider]  blood glucose meter kit and supplies KIT Dispense based on patient and insurance preference. Use daily and as needed to check sugars as directed. (FOR E11.65). 07/18/20  Yes Burns, Claudina Lick, MD  Continuous Blood Gluc Sensor (FREESTYLE LIBRE 3 SENSOR) MISC USE AS DIRECTED 03/09/22  Yes Hoyt Koch, MD  glipiZIDE (GLUCOTROL) 5 MG tablet TAKE 1 TABLET(5 MG) BY MOUTH DAILY BEFORE BREAKFAST 08/07/22  Yes Hoyt Koch, MD  ibuprofen (ADVIL) 400 MG tablet Take 1 tablet (400 mg total) by mouth every 6 (  six) hours as needed. 08/21/21  Yes Biagio Borg, MD  Lancets (ONETOUCH DELICA PLUS [id number]) [place] TEST DAILY AS NEEDED 09/05/20  Yes Hoyt Koch, MD  lisinopril-hydrochlorothiazide (ZESTORETIC) 20-25 MG tablet TAKE 1 TABLET BY MOUTH DAILY 07/30/22  Yes Hoyt Koch, MD  metFORMIN (GLUCOPHAGE) 500 MG tablet TAKE 1 TABLET(500 MG) BY MOUTH TWICE DAILY WITH A MEAL 08/07/22  Yes Hoyt Koch, MD  Multiple Vitamin (MULTIVITAMIN WITH MINERALS) TABS tablet Take 1 tablet by  mouth daily.   Yes [provider]  Omega-3 Fatty Acids (FISH OIL PO) Take 1 capsule by mouth daily.   Yes [provider]  [facility] VERIO test strip TEST DAILY AS NEEDED 07/27/21  Yes Hoyt Koch, MD  rosuvastatin (CRESTOR) 10 MG tablet Take 1 tablet (10 mg total) by mouth daily. 03/09/22  Yes Hoyt Koch, MD  tiZANidine (ZANAFLEX) 2 MG tablet Take 1 tablet (2 mg total) by mouth every 8 (eight) hours as needed for muscle spasms. 08/21/21  Yes Biagio Borg, MD  triamcinolone ointment (KENALOG) 0.1 % Apply topically 2 (two) times daily. 01/12/22  Yes Hoyt Koch, MD    Family History Family History  Problem Relation Age of Onset   Hypertension Mother    Hypertension Father    Hypertension Sister    Hypertension Brother     Social History Social History   Tobacco Use   Smoking status: Never   Smokeless tobacco: Never     Allergies   Patient has no known allergies.   Review of Systems Review of Systems  Cardiovascular:  Positive for syncope.     Physical Exam Triage Vital Signs ED Triage Vitals  Enc Vitals Group     BP 08/08/22 1231 (!) 143/76     Pulse Rate 08/08/22 1231 79     Resp 08/08/22 1231 16     Temp 08/08/22 1231 97.9 F (36.6 C)     Temp Source 08/08/22 1231 Oral     SpO2 08/08/22 1231 94 %     Weight --      Height --      Head Circumference --      Peak Flow --      Pain Score 08/08/22 1229 0     Pain Loc --      Pain Edu? --      Excl. in [place]? --    No data found.  Updated Vital Signs BP (!) 143/76 (BP Location: Right Arm)   Pulse 79   Temp 97.9 F (36.6 C) (Oral)   Resp 16   SpO2 94%   Visual Acuity Right Eye Distance:   Left Eye Distance:   Bilateral Distance:    Right Eye Near:   Left Eye Near:    Bilateral Near:     Physical Exam Vitals and nursing note reviewed.  Constitutional:      Appearance: He is not ill-appearing or toxic-appearing.  HENT:     Head: Normocephalic and  atraumatic.     Right Ear: Hearing, tympanic membrane, ear canal and external ear normal.     Left Ear: Hearing, tympanic membrane, ear canal and external ear normal.     Nose: Nose normal.     Mouth/Throat:     Lips: Pink.     Mouth: Mucous membranes are moist.     Pharynx: No posterior oropharyngeal erythema.  Eyes:     General: Lids are normal. Vision grossly intact. Gaze aligned appropriately.  Extraocular Movements: Extraocular movements intact.     Conjunctiva/sclera: Conjunctivae normal.  Cardiovascular:     Rate and Rhythm: Normal rate and regular rhythm.     Heart sounds: Normal heart sounds, S1 normal and S2 normal.  Pulmonary:     Effort: Pulmonary effort is normal. No respiratory distress.     Breath sounds: Normal breath sounds and air entry.  Musculoskeletal:     Cervical back: Neck supple.  Skin:    General: Skin is warm and dry.     Capillary Refill: Capillary refill takes less than 2 seconds.     Findings: No rash.  Neurological:     General: No focal deficit present.     Mental Status: He is alert and oriented to person, place, and time. Mental status is at baseline.     Cranial Nerves: Cranial nerves 2-12 are intact. No dysarthria or facial asymmetry.     Sensory: Sensation is intact.     Motor: Motor function is intact.     Coordination: Coordination is intact.     Gait: Gait is intact.     Comments: 5/5 strength to bilateral upper and lower extremities. Non focal neuro exam.  Psychiatric:        Mood and Affect: Mood normal.        Speech: Speech normal.        Behavior: Behavior normal.        Thought Content: Thought content normal.        Judgment: Judgment normal.      UC Treatments / Results  Labs (all labs ordered are listed, but only abnormal results are displayed) Labs Reviewed  CBG MONITORING, ED - Abnormal; Notable for the following components:      Result Value   Glucose-Capillary 104 (*)    All other components within normal limits   CBC  BASIC METABOLIC PANEL  POCT URINALYSIS DIPSTICK, ED / UC    EKG   Radiology No results found.  Procedures Procedures (including critical care time)  Medications Ordered in UC Medications - No data to display  Initial Impression / Assessment and Plan / UC Course  I have reviewed the triage vital signs and the nursing notes.  Pertinent labs & imaging results that were available during my care of the patient were reviewed by me and considered in my medical decision making (see chart for details).   1.  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  CBC and BMP obtained today to assess for potential electrolyte abnormality.  He appears to be stable and is tolerating food/fluids well without difficulty.  Heart rate is 79 bpm and and regular rhythm.  EKG is without ST or T wave changes and shows normal sinus rhythm with first-degree AV block at baseline.  EKG today appears similar to EKG performed on July 31, 2021.  Advised patient to increase water intake to at least 64 ounces of water per day to stay well-hydrated.  He is to continue taking his normal medications as prescribed.  He has a follow-up appointment with his PCP on Monday (4 days from now) and has been advised to go to this appointment for ongoing evaluation and management of his chronic medical conditions. No indication for immediate referral to ED based on stable vital signs and clinical presentation today. He is agreeable with this plan. Work note given.  Discussed physical exam  and available lab work findings in clinic with patient.  Counseled patient regarding appropriate use of medications and potential side effects for all medications recommended or prescribed today. Discussed red flag signs and symptoms of worsening condition,when to call the PCP office, return to urgent care, and when  to seek higher level of care in the emergency department. Patient verbalizes understanding and agreement with plan. All questions answered. Patient discharged in stable condition.    Final Clinical Impressions(s) / UC Diagnoses   Final diagnoses:  Syncope, unspecified syncope type  Type 2 diabetes mellitus with other specified complication, without long-term current use of insulin [facility])     Discharge Instructions      Keep taking your medications as prescribed.  I have drawn some blood work today to assess for level and electrolyte abnormality.  We will call you with any abnormal results.  Drink plenty of water, increase water intake to at least 64 ounces of water per day to stay well-hydrated.  Follow-up with your primary care provider on Monday as discussed and as scheduled.   If you develop any new or worsening symptoms or do not improve in the next 2 to 3 days, please return.  If your symptoms are severe, please go to the emergency room.  Follow-up with your primary care provider for further evaluation and management of your symptoms as well as ongoing wellness visits.  I hope you feel better!     ED Prescriptions   None    PDMP not reviewed this encounter.   Talbot Grumbling, [place] 08/08/22 1358

## 2022-08-08 NOTE — ED Triage Notes (Signed)
Patient states he fainted yesterday. Patient was sitting and tried to stand. When standing there was nausea and dizziness, then sat back down. He collapsed and passed out. States the Patient was clammy and his sugar was going up and down. Highest was in the 230s-240s.    Was out for a few seconds and his daughter woke him.   No new foods or medications. Patient is pre-diabetic.

## 2022-08-13 ENCOUNTER — Encounter: Payer: Self-pay | Admitting: Internal Medicine

## 2022-08-13 ENCOUNTER — Ambulatory Visit (INDEPENDENT_AMBULATORY_CARE_PROVIDER_SITE_OTHER): Payer: Medicare HMO | Admitting: Internal Medicine

## 2022-08-13 VITALS — BP 116/60 | HR 75 | Temp 97.8°F | Ht 67.0 in | Wt 161.0 lb

## 2022-08-13 DIAGNOSIS — E1169 Type 2 diabetes mellitus with other specified complication: Secondary | ICD-10-CM

## 2022-08-13 DIAGNOSIS — E876 Hypokalemia: Secondary | ICD-10-CM

## 2022-08-13 DIAGNOSIS — R55 Syncope and collapse: Secondary | ICD-10-CM | POA: Diagnosis not present

## 2022-08-13 LAB — COMPREHENSIVE METABOLIC PANEL
ALT: 32 U/L (ref 0–53)
AST: 26 U/L (ref 0–37)
Albumin: 4.5 g/dL (ref 3.5–5.2)
Alkaline Phosphatase: 46 U/L (ref 39–117)
BUN: 18 mg/dL (ref 6–23)
CO2: 32 mEq/L (ref 19–32)
Calcium: 9.6 mg/dL (ref 8.4–10.5)
Chloride: 97 mEq/L (ref 96–112)
Creatinine, Ser: 1.16 mg/dL (ref 0.40–1.50)
GFR: 62.39 mL/min (ref >60.00)
Glucose, Bld: 160 mg/dL — ABNORMAL HIGH (ref 70–99)
Potassium: 3.6 mEq/L (ref 3.5–5.1)
Sodium: 136 mEq/L (ref 135–145)
Total Bilirubin: 0.6 mg/dL (ref 0.2–1.2)
Total Protein: 7.5 g/dL (ref 6.0–8.3)

## 2022-08-13 MED ORDER — ROSUVASTATIN CALCIUM 10 MG PO TABS: 10.0000 mg | ORAL_TABLET | Freq: Every day | ORAL | 3 refills | Status: DC

## 2022-08-13 MED ORDER — METFORMIN HCL 500 MG PO TABS: ORAL_TABLET | ORAL | 3 refills | Status: DC

## 2022-08-13 MED ORDER — FREESTYLE LIBRE 3 SENSOR MISC: 11 refills | Status: DC

## 2022-08-13 NOTE — Patient Instructions (Addendum)
We will check the labs today. 

## 2022-08-13 NOTE — Progress Notes (Unsigned)
   Subjective:   Patient ID: Richard Lucas, male    DOB: 08-16-1951, 71 y.o.   MRN: [id number]  HPI F/U urgent care for syncope.  Review of Systems  Objective:  Physical Exam  Vitals:   08/13/22 1534  BP: 116/60  Pulse: 75  Temp: 97.8 F (36.6 C)  TempSrc: Oral  SpO2: 97%  Weight: 161 lb (73 kg)  Height: 5\' 7"  (1.702 m)    Assessment & Plan:

## 2022-08-14 ENCOUNTER — Encounter: Payer: Self-pay | Admitting: Internal Medicine

## 2022-08-14 DIAGNOSIS — E876 Hypokalemia: Secondary | ICD-10-CM | POA: Insufficient documentation

## 2022-08-14 NOTE — Assessment & Plan Note (Signed)
Unclear etiology but likely vasovagal. Labs were normal except mildly low K. Rechecking today with CMP.

## 2022-08-14 NOTE — Assessment & Plan Note (Signed)
Needs refill on sensors and given high blood sugars after eating randomly needs to continue with continuous monitoring.

## 2022-08-14 NOTE — Assessment & Plan Note (Signed)
Checking CMP to make sure this has normalized. He was not treated for K 3.1 at urgent care.

## 2022-10-09 ENCOUNTER — Other Ambulatory Visit: Payer: Self-pay | Admitting: Internal Medicine

## 2022-10-09 DIAGNOSIS — I10 Essential (primary) hypertension: Secondary | ICD-10-CM

## 2022-11-01 ENCOUNTER — Other Ambulatory Visit: Payer: Self-pay | Admitting: Internal Medicine

## 2022-11-01 DIAGNOSIS — I10 Essential (primary) hypertension: Secondary | ICD-10-CM

## 2022-12-31 IMAGING — CT CT ABD-PELV W/ CM
1 of 3 series · 13 of 32 positions shown, 19 images · IV contrast (APPLIED)
Comparison: No acute abnormality.

CLINICAL DATA: Epigastric pain. Unexplained weight loss. No history
of malignancy.

EXAM:
CT ABDOMEN AND PELVIS WITH CONTRAST
TECHNIQUE: Multidetector CT imaging of the abdomen and pelvis was performed
using the standard protocol following bolus administration of
intravenous contrast.
CONTRAST:  80mL [id number] IOPAMIDOL (93AT0O-077) INJECTION 61%

[Series 2: abd/pelvis w/cm · axial · 0.69mm/px · z∈[-621,-236]mm · 13 of 91 slices shown, 19 images]
[im 7/91  soft-tissue]
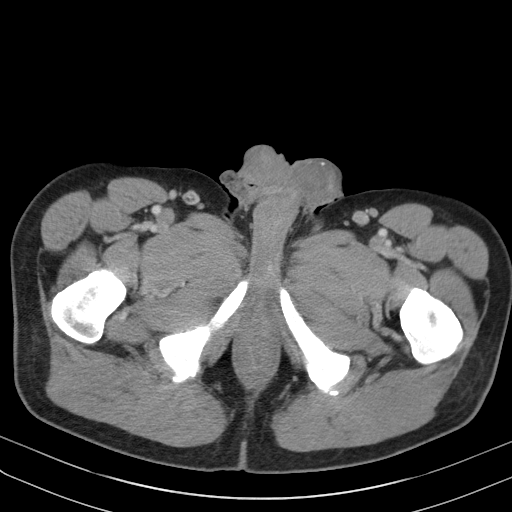
[im 7/91  bone]
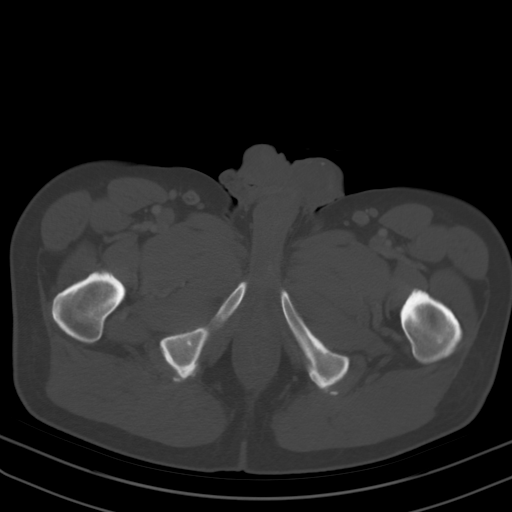
[im 13/91  soft-tissue]
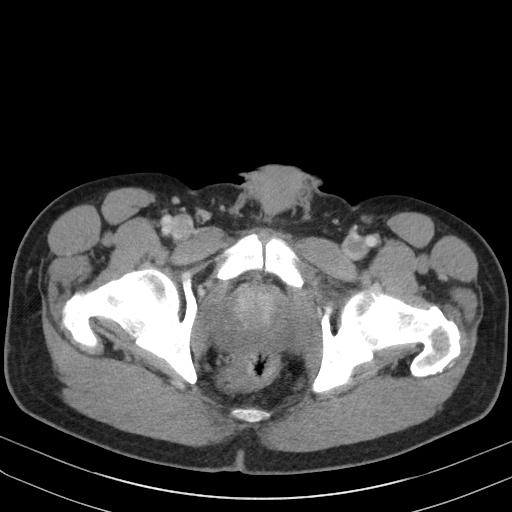
[im 20/91  soft-tissue]
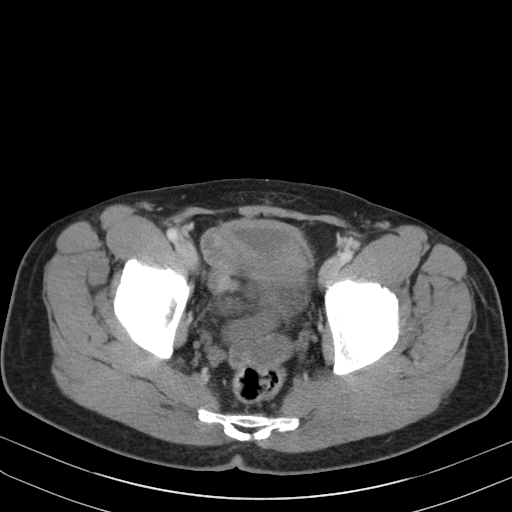
[im 26/91  soft-tissue]
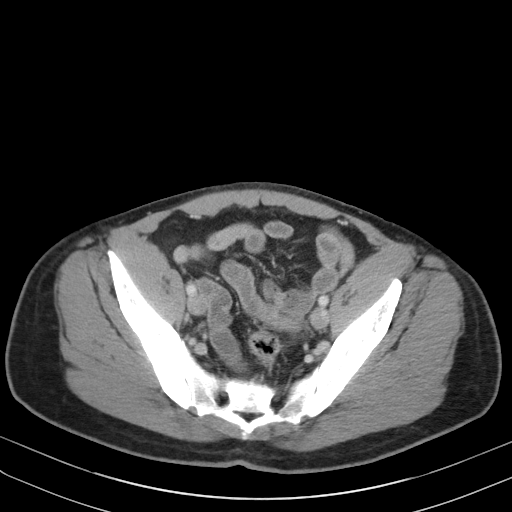
[im 33/91  soft-tissue]
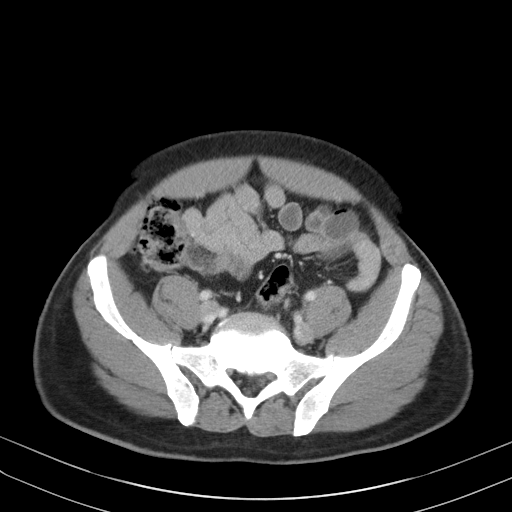
[im 39/91  soft-tissue]
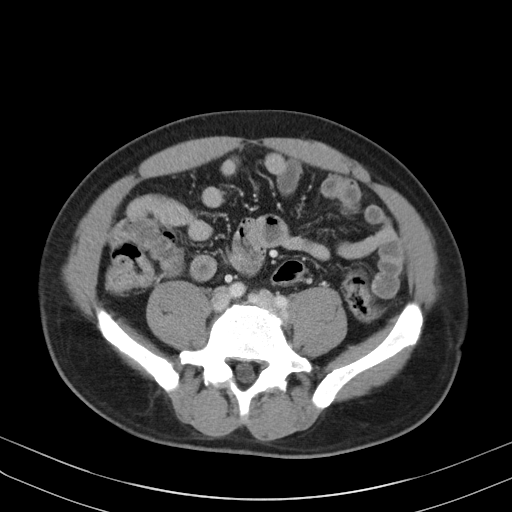
[im 46/91  soft-tissue]
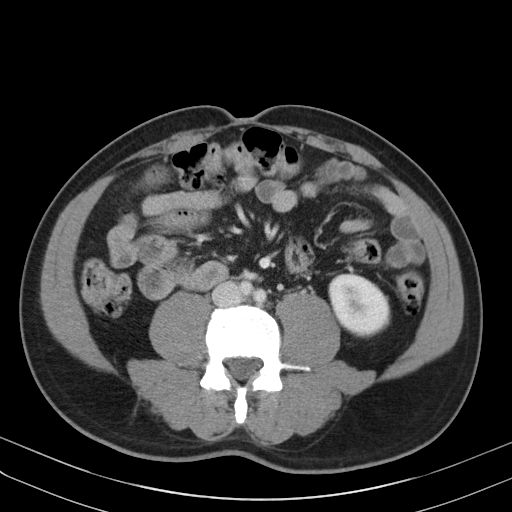
[im 52/91  soft-tissue]
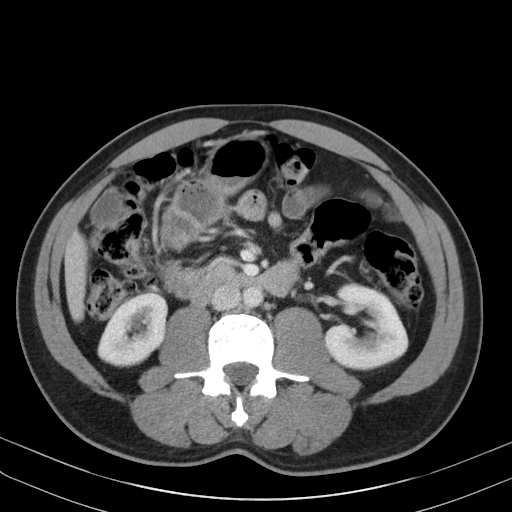
[im 58/91  soft-tissue]
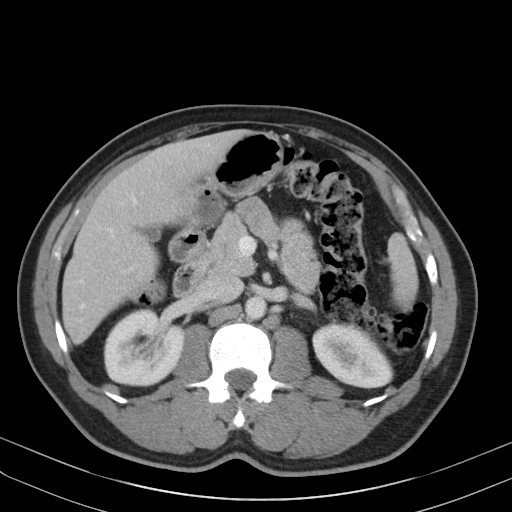
[im 58/91  bone]
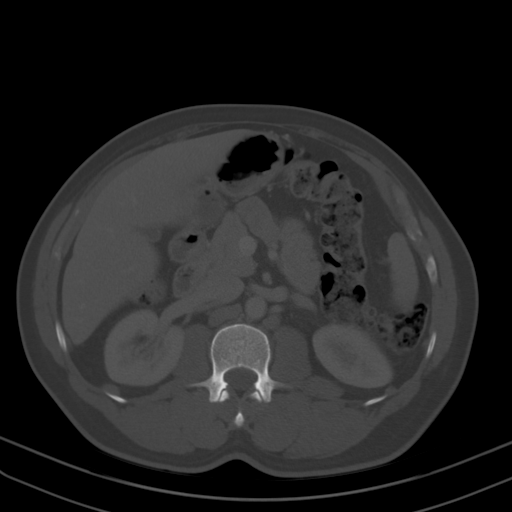
[im 65/91  soft-tissue]
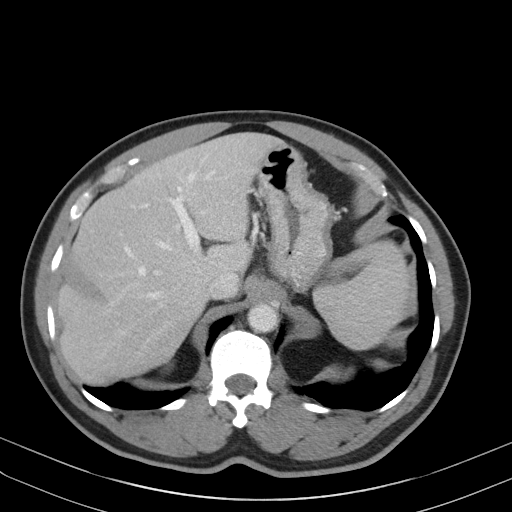
[im 65/91  lung]
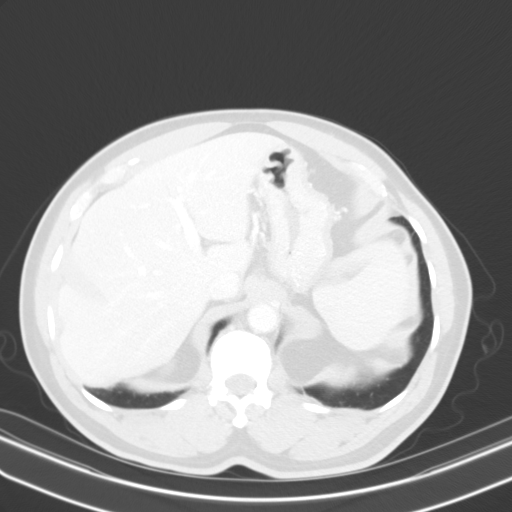
[im 71/91  soft-tissue]
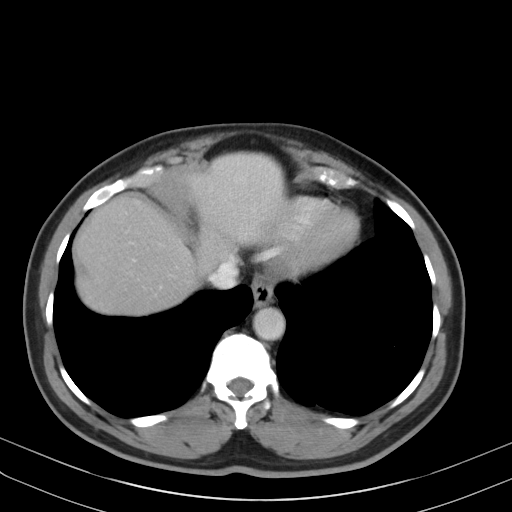
[im 71/91  lung]
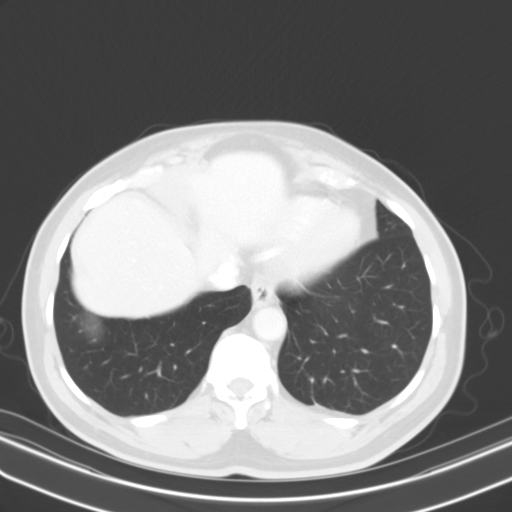
[im 78/91  soft-tissue]
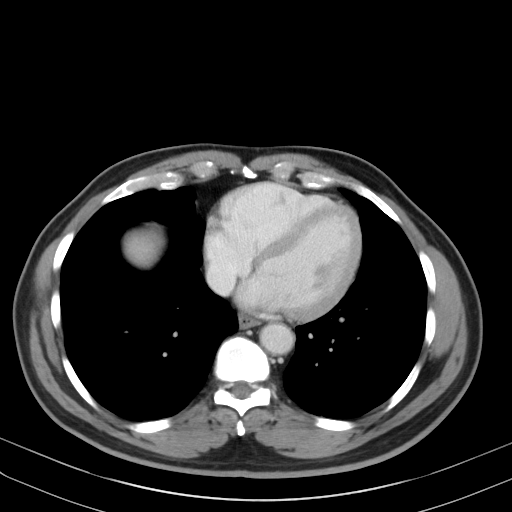
[im 78/91  lung]
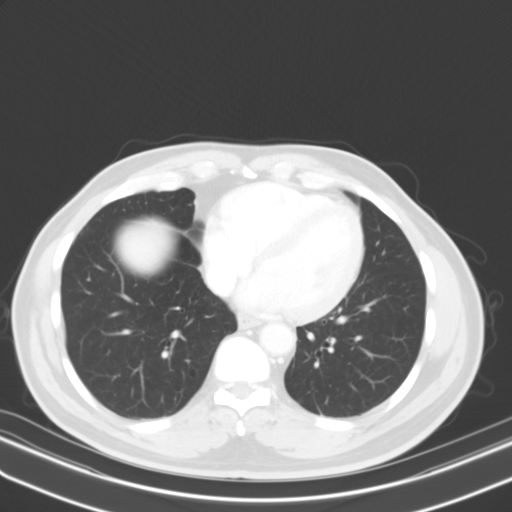
[im 84/91  soft-tissue]
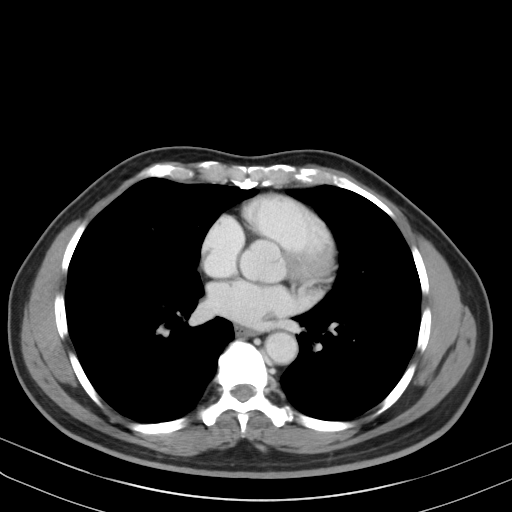
[im 84/91  lung]
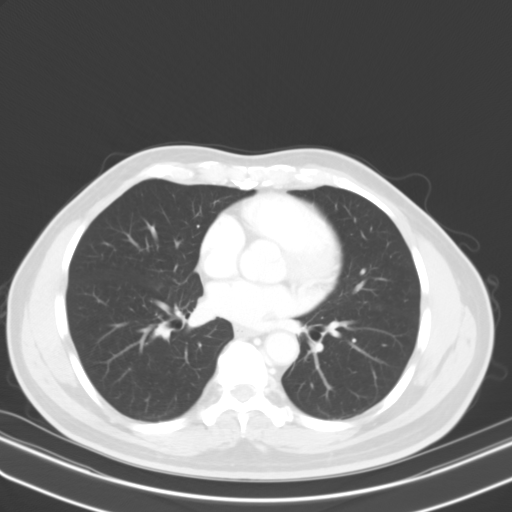

[13 of 32 positions shown; findings below may reference images not displayed]

FINDINGS: Lower chest: Subpleural micronodules in the right lower lobe ([DATE]).
No acute abnormality.

Hepatobiliary:

No focal liver abnormality. No gallstones, gallbladder wall
thickening, or pericholecystic fluid. No biliary dilatation.

Pancreas: No focal lesion. Normal pancreatic contour. No surrounding
inflammatory changes. No main pancreatic ductal dilatation.

Spleen: Normal in size without focal abnormality.

Adrenals/Urinary Tract: No adrenal nodule bilaterally. Bilateral
kidneys enhance symmetrically. No hydronephrosis. No hydroureter.
The urinary bladder is unremarkable.

Stomach/Bowel: Stomach is within normal limits. No evidence of bowel
wall thickening or dilatation. Scattered colonic diverticula.
Appendix appears normal.

Vascular/Lymphatic: No significant vascular findings are present. No
enlarged abdominal or pelvic lymph nodes.

Reproductive: Prostate is enlarged measuring up to 5.8 cm.
Nonspecific calcification within the left scrotum.

Other: No intraperitoneal free fluid. No intraperitoneal free gas.
No organized fluid collection.

Musculoskeletal:

No abdominal wall hernia or abnormality

No suspicious lytic or blastic osseous lesions. No acute displaced
fracture.
IMPRESSION: 1. Enlarged prostate.  Correlate with PSA levels.
2. Scattered colonic diverticula with no acute diverticulitis.

## 2023-03-01 ENCOUNTER — Ambulatory Visit (INDEPENDENT_AMBULATORY_CARE_PROVIDER_SITE_OTHER): Payer: Medicare HMO | Admitting: Internal Medicine

## 2023-03-01 ENCOUNTER — Encounter: Payer: Self-pay | Admitting: Internal Medicine

## 2023-03-01 VITALS — BP 122/80 | HR 84 | Temp 97.5°F | Ht 67.0 in | Wt 166.0 lb

## 2023-03-01 DIAGNOSIS — I872 Venous insufficiency (chronic) (peripheral): Secondary | ICD-10-CM

## 2023-03-01 DIAGNOSIS — Z7984 Long term (current) use of oral hypoglycemic drugs: Secondary | ICD-10-CM

## 2023-03-01 DIAGNOSIS — I10 Essential (primary) hypertension: Secondary | ICD-10-CM

## 2023-03-01 DIAGNOSIS — E785 Hyperlipidemia, unspecified: Secondary | ICD-10-CM | POA: Diagnosis not present

## 2023-03-01 DIAGNOSIS — E1169 Type 2 diabetes mellitus with other specified complication: Secondary | ICD-10-CM | POA: Diagnosis not present

## 2023-03-01 LAB — CBC
HCT: 41.4 % (ref 39.0–52.0)
Hemoglobin: 13.9 g/dL (ref 13.0–17.0)
MCHC: 33.5 g/dL (ref 30.0–36.0)
MCV: 86.4 fl (ref 78.0–100.0)
Platelets: 174 10*3/uL (ref 150.0–400.0)
RBC: 4.79 Mil/uL (ref 4.22–5.81)
RDW: 15.2 % (ref 11.5–15.5)
WBC: 4.8 10*3/uL (ref 4.0–10.5)

## 2023-03-01 LAB — MICROALBUMIN / CREATININE URINE RATIO
Creatinine,U: 99.4 mg/dL
Microalb Creat Ratio: 0.7 mg/g (ref 0.0–30.0)
Microalb, Ur: <0.7 mg/dL (ref 0.0–1.9)

## 2023-03-01 LAB — BRAIN NATRIURETIC PEPTIDE: Pro B Natriuretic peptide (BNP): 8 pg/mL (ref 0.0–100.0)

## 2023-03-01 LAB — COMPREHENSIVE METABOLIC PANEL
ALT: 28 U/L (ref 0–53)
AST: 28 U/L (ref 0–37)
Albumin: 4.3 g/dL (ref 3.5–5.2)
Alkaline Phosphatase: 51 U/L (ref 39–117)
BUN: 21 mg/dL (ref 6–23)
CO2: 31 mEq/L (ref 19–32)
Calcium: 9.7 mg/dL (ref 8.4–10.5)
Chloride: 98 mEq/L (ref 96–112)
Creatinine, Ser: 1.27 mg/dL (ref 0.40–1.50)
GFR: 55.75 mL/min — ABNORMAL LOW (ref >60.00)
Glucose, Bld: 164 mg/dL — ABNORMAL HIGH (ref 70–99)
Potassium: 3.7 mEq/L (ref 3.5–5.1)
Sodium: 139 mEq/L (ref 135–145)
Total Bilirubin: 1 mg/dL (ref 0.2–1.2)
Total Protein: 7.5 g/dL (ref 6.0–8.3)

## 2023-03-01 LAB — HEMOGLOBIN A1C: Hgb A1c MFr Bld: 6 % (ref 4.6–6.5)

## 2023-03-01 LAB — LIPID PANEL
Cholesterol: 165 mg/dL (ref 0–200)
HDL: 34.2 mg/dL — ABNORMAL LOW (ref >39.00)
Total CHOL/HDL Ratio: 5
Triglycerides: 447 mg/dL — ABNORMAL HIGH (ref 0.0–149.0)

## 2023-03-01 LAB — LDL CHOLESTEROL, DIRECT: Direct LDL: 100 mg/dL

## 2023-03-01 NOTE — Assessment & Plan Note (Signed)
Checking BNP to rule out change. Checking CMP as well.

## 2023-03-01 NOTE — Progress Notes (Signed)
   Subjective:   Patient ID: Richard Lucas, male    DOB: [birth year], 72 y.o.   MRN: [id number]  HPI The patient is a 72 YO man coming in for swelling in legs. Also concerned about his sugars.   Review of Systems  Constitutional: Negative.   HENT: Negative.    Eyes: Negative.   Respiratory:  Negative for cough, chest tightness and shortness of breath.   Cardiovascular:  Positive for leg swelling. Negative for chest pain and palpitations.  Gastrointestinal:  Negative for abdominal distention, abdominal pain, constipation, diarrhea, nausea and vomiting.  Musculoskeletal: Negative.   Skin: Negative.   Neurological: Negative.   Psychiatric/Behavioral: Negative.      Objective:  Physical Exam Constitutional:      Appearance: He is well-developed.  HENT:     Head: Normocephalic and atraumatic.  Cardiovascular:     Rate and Rhythm: Normal rate and regular rhythm.  Pulmonary:     Effort: Pulmonary effort is normal. No respiratory distress.     Breath sounds: Normal breath sounds. No wheezing or rales.  Abdominal:     General: Bowel sounds are normal. There is no distension.     Palpations: Abdomen is soft.     Tenderness: There is no abdominal tenderness. There is no rebound.  Musculoskeletal:     Cervical back: Normal range of motion.     Comments: 1+ pitting edema bilateral shins  Skin:    General: Skin is warm and dry.  Neurological:     Mental Status: He is alert and oriented to person, place, and time.     Coordination: Coordination normal.     Vitals:   03/01/23 1012  BP: 122/80  Pulse: 84  Temp: (!) 97.5 F (36.4 C)  TempSrc: Oral  SpO2: 98%  Weight: 166 lb (75.3 kg)  Height: 5\' 7"  (1.702 m)    Assessment & Plan:

## 2023-03-01 NOTE — Assessment & Plan Note (Signed)
BP at goal checking CMP. Continue lisinopril/hydrochlorothiazide 20/25 mg daily adjust as needed.

## 2023-03-01 NOTE — Patient Instructions (Signed)
We will check the labs today and if normal we can check an ultrasound to look at the blood flow in the legs.

## 2023-03-01 NOTE — Assessment & Plan Note (Signed)
Checking lipid panel and adjust crestor 10 mg daily as needed for LDL <100 goal.

## 2023-03-01 NOTE — Assessment & Plan Note (Signed)
Checking HgA1c, microalbumin to creatinine ratio and lipid panel and CMP. Foot exam done. Adjust metformin 500 mg BID and glipizide 5 mg daily as needed. On ACE-I and statin.

## 2023-05-09 ENCOUNTER — Other Ambulatory Visit: Payer: Self-pay | Admitting: Internal Medicine

## 2023-05-09 DIAGNOSIS — I10 Essential (primary) hypertension: Secondary | ICD-10-CM

## 2023-05-29 ENCOUNTER — Other Ambulatory Visit: Payer: Self-pay | Admitting: Internal Medicine

## 2023-06-14 ENCOUNTER — Other Ambulatory Visit: Payer: Self-pay | Admitting: Internal Medicine

## 2023-06-14 DIAGNOSIS — Z1211 Encounter for screening for malignant neoplasm of colon: Secondary | ICD-10-CM

## 2023-06-14 DIAGNOSIS — Z1212 Encounter for screening for malignant neoplasm of rectum: Secondary | ICD-10-CM

## 2023-08-14 ENCOUNTER — Other Ambulatory Visit: Payer: Self-pay | Admitting: Internal Medicine

## 2023-08-31 ENCOUNTER — Other Ambulatory Visit: Payer: Self-pay | Admitting: Internal Medicine

## 2023-09-02 ENCOUNTER — Other Ambulatory Visit: Payer: Self-pay | Admitting: Internal Medicine

## 2023-09-02 MED ORDER — FREESTYLE LIBRE 3 SENSOR MISC: 11 refills | Status: DC

## 2023-09-02 MED ORDER — METFORMIN HCL 500 MG PO TABS: ORAL_TABLET | ORAL | 3 refills | Status: DC

## 2023-09-02 NOTE — Telephone Encounter (Signed)
Copied from CRM [id number]. Topic: Clinical - Medication Refill >> Sep 02, 2023  9:32 AM Kathryne Eriksson wrote: Most Recent Primary Care Visit:  Provider: Hillard Danker A  Department: [facility]  Visit Type: OFFICE VISIT  Date: 03/01/2023  Medication: Continuous Blood Gluc Sensor (FREESTYLE LIBRE 3 SENSOR) MISC  metFORMIN (GLUCOPHAGE) 500 MG tablet   Has the patient contacted their pharmacy? Yes (Agent: If no, request that the patient contact the pharmacy for the refill. If patient does not wish to contact the pharmacy document the reason why and proceed with request.) (Agent: If yes, when and what did the pharmacy advise?)  Is this the correct pharmacy for this prescription? Yes If no, delete pharmacy and type the correct one.  This is the patient's preferred pharmacy:  [facility] [phone] - [place], [place] - [address] AT [facility] [address] [place] [address] Phone: [phone] Fax: [phone]  [id number] - [place], [place] - [address] AT [facility] OF [address] [place] [address] Phone: [phone] Fax: [phone]  [id number] - [place],  - [address] AT [facility] [address] [place] [address] Phone: [phone] Fax: [phone]   Has the prescription been filled recently? Yes  Is the patient out of the medication? Yes  Has the patient been seen for an appointment in the last year OR does the patient have an upcoming appointment? Yes  Can we respond through MyChart? Yes  Agent: Please be advised that Rx refills may take up to 3 business days. We ask that you follow-up with your pharmacy.

## 2023-09-26 ENCOUNTER — Ambulatory Visit: Payer: Self-pay | Admitting: Internal Medicine

## 2023-09-26 NOTE — Telephone Encounter (Signed)
Chief Complaint: cold; finger pain; leg swelling Symptoms: nasal congestion, chest congestion, bilateral leg swelling, felt feverish, left middle finger pain, mild SOB "feels like congestion" Frequency: finger pain x couple of weeks; leg swelling x years and worsened x 1 week; cold symptoms x 2 days Pertinent Negatives: Patient denies sore throat, cough, headaches, earaches, chest pain, redness to legs, swelling in feet Disposition: [] ED /[] Urgent Care (no appt availability in office) / [x] Appointment(In office/virtual)/ []  [facility]/ [] Home Care/ [] Refused Recommended Disposition /[] [facility]/ []  Follow-up with PCP Additional Notes: Patient c/o cold and congestion symptoms x 2 days. Patient then also states left middle finger pain. When addressing left middle finger pain, patient adds bilateral leg swelling. Patient poor historian, limited triage assessment.  Copied from CRM [id number]. Topic: Clinical - Red Word Triage >> Sep 26, 2023 11:12 AM Steele Sizer wrote: Red Word that prompted transfer to Nurse Triage: Pt stated that he has a cold for about two days. Symptoms are fever, swollen legs, a sharp pain in his finger on his left hand, no appetite. Reason for Disposition  [1] MODERATE pain (e.g., interferes with normal activities) AND [2] present > 3 days  [1] Sinus congestion as part of a cold AND [2] present < 10 days  [1] MODERATE leg swelling (e.g., swelling extends up to knees) AND [2] new-onset or worsening  Answer Assessment - Initial Assessment Questions 1. LOCATION: "Where does it hurt?"      Patient denies sinus pain.  2. ONSET: "When did the congestion start?"  (e.g., hours, days)      X 2 days.  3. SEVERITY: "How bad is the pain?"   (Scale 1-10; mild, moderate or severe)   - MILD (1-3): doesn't interfere with normal activities    - MODERATE (4-7): interferes with normal activities (e.g., work or school) or awakens from sleep   - SEVERE (8-10):  excruciating pain and patient unable to do any normal activities        Denies headaches, earaches, sinus pain.  4. RECURRENT SYMPTOM: "Have you ever had sinus problems before?" If Yes, ask: "When was the last time?" and "What happened that time?"      Denies.  5. NASAL CONGESTION: "Is the nose blocked?" If Yes, ask: "Can you open it or must you breathe through your mouth?"     Patient states he can breathe through his nose.  6. NASAL DISCHARGE: "Do you have discharge from your nose?" If so ask, "What color?"     Patient states it is drying up now but initially was runny. Greyish colored.  7. FEVER: "Do you have a fever?" If Yes, ask: "What is it, how was it measured, and when did it start?"      Patient did not check his temperature but states he felt like it was a fever yesterday and today. Patient states it felt worse yesterday.  8. OTHER SYMPTOMS: "Do you have any other symptoms?" (e.g., sore throat, cough, earache, difficulty breathing)     Slight SOB, states it was worse yesterday due to congestion.  Answer Assessment - Initial Assessment Questions 1. ONSET: "When did the pain start?"      Couple of weeks.  2. LOCATION and RADIATION: "Where is the pain located?"  (e.g., fingertip, around nail, joint, entire  finger)      Left hand middle finger.  3. SEVERITY: "How bad is the pain?" "What does it keep you from doing?"   (Scale 1-10; or mild,  moderate, severe)  - MILD (1-3): doesn't interfere with normal activities.   - MODERATE (4-7): interferes with normal activities or awakens from sleep.  - SEVERE (8-10): excruciating pain, unable to hold a glass of water or bend finger even a little.     8/10.  4. APPEARANCE: "What does the finger look like?" (e.g., redness, swelling, bruising, pallor)     Denies.  5. WORK OR EXERCISE: "Has there been any recent work or exercise that involved this part (i.e., fingers or hand) of the body?"     Denies.  6. CAUSE: "What do you think is  causing the pain?"     Unsure.  7. AGGRAVATING FACTORS: "What makes the pain worse?" (e.g., using computer)     Patient states movement or use of his hand sometimes makes him notice the sharp pain more.  8. OTHER SYMPTOMS: "Do you have any other symptoms?" (e.g., fever, neck pain, numbness)     Felt feverish yesterday and today.  Answer Assessment - Initial Assessment Questions 1. ONSET: "When did the swelling start?" (e.g., minutes, hours, days)     X 1 week. 2. LOCATION: "What part of the leg is swollen?"  "Are both legs swollen or just one leg?"     Patient states both legs, does not include feet.  3. SEVERITY: "How bad is the swelling?" (e.g., localized; mild, moderate, severe)   - Localized: Small area of swelling localized to one leg.   - MILD pedal edema: Swelling limited to foot and ankle, pitting edema < 1/4 inch (6 mm) deep, rest and elevation eliminate most or all swelling.   - MODERATE edema: Swelling of lower leg to knee, pitting edema > 1/4 inch (6 mm) deep, rest and elevation only partially reduce swelling.   - SEVERE edema: Swelling extends above knee, facial or hand swelling present.      Moderate.  4. REDNESS: "Does the swelling look red or infected?"     Denies redness.  5. PAIN: "Is the swelling painful to touch?" If Yes, ask: "How painful is it?"   (Scale 1-10; mild, moderate or severe)     Patient states he would not call it pain, but "the skin is weak."  6. FEVER: "Do you have a fever?" If Yes, ask: "What is it, how was it measured, and when did it start?"      Felt feverish yesterday and today.  7. CAUSE: "What do you think is causing the leg swelling?"     Patient unsure states he has a history of his legs swelling for over 20 years.  8. MEDICAL HISTORY: "Do you have a history of blood clots (e.g., DVT), cancer, heart failure, kidney disease, or liver failure?"     Denies.  9. RECURRENT SYMPTOM: "Have you had leg swelling before?" If Yes, ask: "When was  the last time?" "What happened that time?"     Patient unsure.  10. OTHER SYMPTOMS: "Do you have any other symptoms?" (e.g., chest pain, difficulty breathing)       Mild/slight SOB.  Protocols used: Sinus Pain or Congestion-A-AH, Finger Pain-A-AH, Leg Swelling and Edema-A-AH

## 2023-09-27 ENCOUNTER — Encounter: Payer: Self-pay | Admitting: Internal Medicine

## 2023-09-27 ENCOUNTER — Ambulatory Visit (INDEPENDENT_AMBULATORY_CARE_PROVIDER_SITE_OTHER): Payer: Medicare Other | Admitting: Internal Medicine

## 2023-09-27 VITALS — BP 120/62 | HR 77 | Temp 97.7°F | Ht 67.0 in | Wt 164.0 lb

## 2023-09-27 DIAGNOSIS — Z7984 Long term (current) use of oral hypoglycemic drugs: Secondary | ICD-10-CM

## 2023-09-27 DIAGNOSIS — Z23 Encounter for immunization: Secondary | ICD-10-CM

## 2023-09-27 DIAGNOSIS — J069 Acute upper respiratory infection, unspecified: Secondary | ICD-10-CM | POA: Diagnosis not present

## 2023-09-27 DIAGNOSIS — I872 Venous insufficiency (chronic) (peripheral): Secondary | ICD-10-CM

## 2023-09-27 DIAGNOSIS — M79642 Pain in left hand: Secondary | ICD-10-CM | POA: Insufficient documentation

## 2023-09-27 DIAGNOSIS — E1169 Type 2 diabetes mellitus with other specified complication: Secondary | ICD-10-CM | POA: Diagnosis not present

## 2023-09-27 LAB — CBC
HCT: 38.8 % — ABNORMAL LOW (ref 39.0–52.0)
Hemoglobin: 13 g/dL (ref 13.0–17.0)
MCHC: 33.5 g/dL (ref 30.0–36.0)
MCV: 86 fL (ref 78.0–100.0)
Platelets: 183 10*3/uL (ref 150.0–400.0)
RBC: 4.51 Mil/uL (ref 4.22–5.81)
RDW: 14.3 % (ref 11.5–15.5)
WBC: 4 10*3/uL (ref 4.0–10.5)

## 2023-09-27 LAB — COMPREHENSIVE METABOLIC PANEL
ALT: 68 U/L — ABNORMAL HIGH (ref 0–53)
AST: 47 U/L — ABNORMAL HIGH (ref 0–37)
Albumin: 4.5 g/dL (ref 3.5–5.2)
Alkaline Phosphatase: 60 U/L (ref 39–117)
BUN: 20 mg/dL (ref 6–23)
CO2: 32 meq/L (ref 19–32)
Calcium: 9.8 mg/dL (ref 8.4–10.5)
Chloride: 97 meq/L (ref 96–112)
Creatinine, Ser: 1.2 mg/dL (ref 0.40–1.50)
GFR: 59.43 mL/min — ABNORMAL LOW (ref >60.00)
Glucose, Bld: 109 mg/dL — ABNORMAL HIGH (ref 70–99)
Potassium: 3.7 meq/L (ref 3.5–5.1)
Sodium: 139 meq/L (ref 135–145)
Total Bilirubin: 0.7 mg/dL (ref 0.2–1.2)
Total Protein: 8.1 g/dL (ref 6.0–8.3)

## 2023-09-27 LAB — BRAIN NATRIURETIC PEPTIDE: Pro B Natriuretic peptide (BNP): 8 pg/mL (ref 0.0–100.0)

## 2023-09-27 LAB — HEMOGLOBIN A1C: Hgb A1c MFr Bld: 6.7 % — ABNORMAL HIGH (ref 4.6–6.5)

## 2023-09-27 NOTE — Assessment & Plan Note (Signed)
Checking HgA1c and adjust as needed. Previously well controlled with metformin and glipizide. Adjust as needed.

## 2023-09-27 NOTE — Assessment & Plan Note (Signed)
Some worse lately and he has some increased stress and some admitted dietary changes. Checking CMP and BNP today. Adjust as needed.

## 2023-09-27 NOTE — Assessment & Plan Note (Signed)
Overall resolving. Reassurance given and no clinical findings to suggest lack of resolution. No indication for antibiotics or prescription medication today.

## 2023-09-27 NOTE — Assessment & Plan Note (Signed)
Uncertain etiology. Could be arthritis but no typical stiffness or gradual progression.

## 2023-09-27 NOTE — Patient Instructions (Signed)
We will check the labs today. 

## 2023-09-27 NOTE — Progress Notes (Signed)
   Subjective:   Patient ID: Richard Lucas, male    DOB: [birth year], 73 y.o.   MRN: [id number]  URI  Associated symptoms include congestion, coughing and rhinorrhea. Pertinent negatives include no abdominal pain, chest pain, diarrhea, nausea or vomiting.   The patient is a 73 YO man coming in for several concerns including leg swelling (started about 1 week ago, maybe more sodium, no travel or change in medications, no pain, both legs, this happens from time to time), and cold symptoms (started a week or so ago, improving overall, took some cold medicine last night, mild cough and congestion no SOB, some fevers during course resolved now) and hand pain (left hand, 3rd finger gets a stab of pain with use from time to time not consistent, not stiff in the mornings, denies swelling or redness, no injury or splinter or foreign body recently). Also needs follow up labs.  Review of Systems  Constitutional:  Positive for fatigue.  HENT:  Positive for congestion, postnasal drip and rhinorrhea.   Eyes: Negative.   Respiratory:  Positive for cough. Negative for chest tightness and shortness of breath.   Cardiovascular:  Positive for leg swelling. Negative for chest pain and palpitations.  Gastrointestinal:  Negative for abdominal distention, abdominal pain, constipation, diarrhea, nausea and vomiting.  Musculoskeletal:  Positive for arthralgias.  Skin: Negative.   Neurological: Negative.   Psychiatric/Behavioral: Negative.      Objective:  Physical Exam Constitutional:      Appearance: He is well-developed.  HENT:     Head: Normocephalic and atraumatic.     Comments: Oropharynx with redness and clear drainage, nose with swollen turbinates, TMs normal bilaterally.  Neck:     Thyroid: No thyromegaly.  Cardiovascular:     Rate and Rhythm: Normal rate and regular rhythm.  Pulmonary:     Effort: Pulmonary effort is normal. No respiratory distress.     Breath sounds: Normal breath sounds. No  wheezing or rales.  Abdominal:     General: Bowel sounds are normal. There is no distension.     Palpations: Abdomen is soft.     Tenderness: There is no abdominal tenderness. There is no rebound.  Musculoskeletal:        General: No tenderness.     Cervical back: Normal range of motion.     Right lower leg: Edema present.     Left lower leg: Edema present.     Comments: No tenderness to palpation during exam left hand no swelling or joint deformity present, 1+ pitting edema bilateral to mid shins with chronic skin changes stable without redness  Lymphadenopathy:     Cervical: No cervical adenopathy.  Skin:    General: Skin is warm and dry.  Neurological:     Mental Status: He is alert and oriented to person, place, and time.     Coordination: Coordination normal.     Vitals:   09/27/23 0737  BP: 120/62  Pulse: 77  Temp: 97.7 F (36.5 C)  TempSrc: Oral  SpO2: 98%  Weight: 164 lb (74.4 kg)  Height: 5\' 7"  (1.702 m)    Assessment & Plan:  Flu shot given at visit.

## 2023-10-18 ENCOUNTER — Ambulatory Visit (INDEPENDENT_AMBULATORY_CARE_PROVIDER_SITE_OTHER): Payer: Medicare Other | Admitting: Internal Medicine

## 2023-10-18 ENCOUNTER — Encounter: Payer: Self-pay | Admitting: Internal Medicine

## 2023-10-18 VITALS — BP 140/60 | HR 74 | Temp 98.1°F | Ht 67.0 in | Wt 157.0 lb

## 2023-10-18 DIAGNOSIS — E1169 Type 2 diabetes mellitus with other specified complication: Secondary | ICD-10-CM | POA: Diagnosis not present

## 2023-10-18 DIAGNOSIS — J069 Acute upper respiratory infection, unspecified: Secondary | ICD-10-CM

## 2023-10-18 DIAGNOSIS — I10 Essential (primary) hypertension: Secondary | ICD-10-CM | POA: Diagnosis not present

## 2023-10-18 DIAGNOSIS — R7989 Other specified abnormal findings of blood chemistry: Secondary | ICD-10-CM | POA: Insufficient documentation

## 2023-10-18 DIAGNOSIS — R066 Hiccough: Secondary | ICD-10-CM | POA: Insufficient documentation

## 2023-10-18 DIAGNOSIS — Z7984 Long term (current) use of oral hypoglycemic drugs: Secondary | ICD-10-CM

## 2023-10-18 LAB — URINALYSIS, ROUTINE W REFLEX MICROSCOPIC
Bilirubin Urine: NEGATIVE
Ketones, ur: NEGATIVE
Leukocytes,Ua: NEGATIVE
Nitrite: NEGATIVE
Specific Gravity, Urine: 1.025 (ref 1.000–1.030)
Total Protein, Urine: >=300 — AB
Urine Glucose: NEGATIVE
Urobilinogen, UA: 0.2 (ref 0.0–1.0)
pH: 6 (ref 5.0–8.0)

## 2023-10-18 LAB — COMPREHENSIVE METABOLIC PANEL
ALT: 23 U/L (ref 0–53)
AST: 19 U/L (ref 0–37)
Albumin: 4.1 g/dL (ref 3.5–5.2)
Alkaline Phosphatase: 67 U/L (ref 39–117)
BUN: 16 mg/dL (ref 6–23)
CO2: 32 meq/L (ref 19–32)
Calcium: 9.5 mg/dL (ref 8.4–10.5)
Chloride: 100 meq/L (ref 96–112)
Creatinine, Ser: 1.18 mg/dL (ref 0.40–1.50)
GFR: 60.62 mL/min (ref >60.00)
Glucose, Bld: 123 mg/dL — ABNORMAL HIGH (ref 70–99)
Potassium: 3.8 meq/L (ref 3.5–5.1)
Sodium: 143 meq/L (ref 135–145)
Total Bilirubin: 0.8 mg/dL (ref 0.2–1.2)
Total Protein: 7.8 g/dL (ref 6.0–8.3)

## 2023-10-18 MED ORDER — TIZANIDINE HCL 2 MG PO TABS: 2.0000 mg | ORAL_TABLET | Freq: Three times a day (TID) | ORAL | 0 refills | Status: DC | PRN

## 2023-10-18 NOTE — Assessment & Plan Note (Signed)
 Refill tizandine  to use prn hiccups he has used in the past.

## 2023-10-18 NOTE — Patient Instructions (Signed)
 We have sent in the muscle relaxer tizanidine  to use for hiccups as needed.   We will recheck the labs.

## 2023-10-18 NOTE — Assessment & Plan Note (Signed)
 Checking microalbumin to creatinine ratio and U/A.

## 2023-10-18 NOTE — Assessment & Plan Note (Signed)
 Checking CMP and adjust as needed. Diet was different prior to labs with increased stress which could have been related.

## 2023-10-18 NOTE — Progress Notes (Signed)
   Subjective:   Patient ID: Richard Lucas, male    DOB: [birth year], 73 y.o.   MRN: [id number]  HPI The patient is a 73 YO man coming in for sore throat and some hiccups. Started 3 days ago and and cough as well. Taking otc and overall improving. Is also concerned about elevated liver numbers on recent labs.   Review of Systems  Constitutional: Negative.   HENT:  Positive for sore throat.   Eyes: Negative.   Respiratory:  Negative for cough, chest tightness and shortness of breath.   Cardiovascular:  Negative for chest pain, palpitations and leg swelling.  Gastrointestinal:  Negative for abdominal distention, abdominal pain, constipation, diarrhea, nausea and vomiting.  Musculoskeletal: Negative.   Skin: Negative.   Neurological: Negative.   Psychiatric/Behavioral: Negative.      Objective:  Physical Exam Constitutional:      Appearance: He is well-developed.  HENT:     Head: Normocephalic and atraumatic.  Cardiovascular:     Rate and Rhythm: Normal rate and regular rhythm.  Pulmonary:     Effort: Pulmonary effort is normal. No respiratory distress.     Breath sounds: Normal breath sounds. No wheezing or rales.  Abdominal:     General: Bowel sounds are normal. There is no distension.     Palpations: Abdomen is soft.     Tenderness: There is no abdominal tenderness. There is no rebound.  Musculoskeletal:     Cervical back: Normal range of motion.  Skin:    General: Skin is warm and dry.  Neurological:     Mental Status: He is alert and oriented to person, place, and time.     Coordination: Coordination normal.     Vitals:   10/18/23 1110 10/18/23 1116  BP: (!) 140/60 (!) 140/60  Pulse: 74   Temp: 98.1 F (36.7 C)   TempSrc: Oral   SpO2: 96%   Weight: 157 lb (71.2 kg)   Height: 5' 7 (1.702 m)     Assessment & Plan:

## 2023-10-18 NOTE — Assessment & Plan Note (Signed)
 Checking CMP as liver numbers elevated on recent check. BP borderline today likely due to cold/flu medication he has taken.

## 2023-10-18 NOTE — Assessment & Plan Note (Signed)
 Has had sore throat and cough 3 days and improving overall. Reassurance given and no testing done as outside window for treatment and mild symptoms.

## 2023-10-21 ENCOUNTER — Encounter: Payer: Self-pay | Admitting: Internal Medicine

## 2023-10-23 ENCOUNTER — Ambulatory Visit: Payer: Medicare Other

## 2023-10-23 VITALS — BP 138/62 | HR 90 | Ht 65.5 in | Wt 158.0 lb

## 2023-10-23 DIAGNOSIS — Z1211 Encounter for screening for malignant neoplasm of colon: Secondary | ICD-10-CM | POA: Diagnosis not present

## 2023-10-23 DIAGNOSIS — Z0000 Encounter for general adult medical examination without abnormal findings: Secondary | ICD-10-CM

## 2023-10-23 NOTE — Progress Notes (Signed)
Subjective:   Richard Lucas is a 73 y.o. male who presents for Medicare Annual/Subsequent preventive examination.  Visit Complete: In person  Cardiac Risk Factors include: advanced age (>66men, >65 women);male gender;diabetes mellitus;dyslipidemia;hypertension     Objective:    Today's Vitals   10/23/23 1043  BP: 138/62  Pulse: 90  Weight: 158 lb (71.7 kg)  Height: 5' 5.5" (1.664 m)   Body mass index is 25.89 kg/m.     10/23/2023   10:41 AM  Advanced Directives  Does Patient Have a Medical Advance Directive? No  Would patient like information on creating a medical advance directive? No - Patient declined    Current Medications (verified) Outpatient Encounter Medications as of 10/23/2023  Medication Sig   acetaminophen (TYLENOL) 500 MG tablet Take 2 tablets (1,000 mg total) by mouth every 6 (six) hours as needed.   alfuzosin (UROXATRAL) 10 MG 24 hr tablet Take 10 mg by mouth at bedtime.   Ascorbic Acid (VITAMIN C PO) Take 1 tablet by mouth daily.   blood glucose meter kit and supplies KIT Dispense based on patient and insurance preference. Use daily and as needed to check sugars as directed. (FOR E11.65).   Continuous Glucose Sensor (FREESTYLE LIBRE 3 SENSOR) MISC USE AS DIRECTED   Continuous Glucose Sensor (FREESTYLE LIBRE 3 SENSOR) MISC USE AS DIRECTED   glipiZIDE (GLUCOTROL) 5 MG tablet TAKE 1 TABLET(5 MG) BY MOUTH DAILY BEFORE BREAKFAST   ibuprofen (ADVIL) 400 MG tablet Take 1 tablet (400 mg total) by mouth every 6 (six) hours as needed.   Lancets (ONETOUCH DELICA PLUS LANCET33G) MISC TEST DAILY AS NEEDED   lisinopril-hydrochlorothiazide (ZESTORETIC) 20-25 MG tablet TAKE 1 TABLET BY MOUTH DAILY   metFORMIN (GLUCOPHAGE) 500 MG tablet TAKE 1 TABLET(500 MG) BY MOUTH TWICE DAILY WITH A MEAL   Multiple Vitamin (MULTIVITAMIN WITH MINERALS) TABS tablet Take 1 tablet by mouth daily.   Omega-3 Fatty Acids (FISH OIL PO) Take 1 capsule by mouth daily.   ONETOUCH VERIO test strip  TEST DAILY AS NEEDED   rosuvastatin (CRESTOR) 10 MG tablet Take 1 tablet (10 mg total) by mouth daily.   tiZANidine (ZANAFLEX) 2 MG tablet Take 1 tablet (2 mg total) by mouth every 8 (eight) hours as needed for muscle spasms.   triamcinolone ointment (KENALOG) 0.1 % Apply topically 2 (two) times daily.   [DISCONTINUED] metFORMIN (GLUCOPHAGE) 500 MG tablet TAKE 1 TABLET(500 MG) BY MOUTH TWICE DAILY WITH A MEAL   [DISCONTINUED] metFORMIN (GLUCOPHAGE) 500 MG tablet TAKE 1 TABLET(500 MG) BY MOUTH TWICE DAILY WITH A MEAL   No facility-administered encounter medications on file as of 10/23/2023.    Allergies (verified) Patient has no known allergies.   History: Past Medical History:  Diagnosis Date   Hypertension    History reviewed. No pertinent surgical history. Family History  Problem Relation Age of Onset   Hypertension Mother    Hypertension Father    Hypertension Sister    Hypertension Brother    Social History   Socioeconomic History   Marital status: Married    Spouse name: Not on file   Number of children: Not on file   Years of education: Not on file   Highest education level: Not on file  Occupational History   Occupation: CNA  Tobacco Use   Smoking status: Never   Smokeless tobacco: Never  Substance and Sexual Activity   Alcohol use: Not on file   Drug use: Not on file   Sexual activity:  Not on file  Other Topics Concern   Not on file  Social History Narrative   From [place].   Social Drivers of Corporate investment banker Strain: Low Risk  (10/23/2023)   Overall Financial Resource Strain (CARDIA)    Difficulty of Paying Living Expenses: Not hard at all  Food Insecurity: No Food Insecurity (10/23/2023)   Hunger Vital Sign    Worried About Running Out of Food in the Last Year: Never true    Ran Out of Food in the Last Year: Never true  Transportation Needs: No Transportation Needs (10/23/2023)   PRAPARE - Administrator, Civil Service (Medical):  No    Lack of Transportation (Non-Medical): No  Physical Activity: Sufficiently Active (10/23/2023)   Exercise Vital Sign    Days of Exercise per Week: 3 days    Minutes of Exercise per Session: 110 min  Stress: No Stress Concern Present (10/23/2023)   [employer] of Occupational Health - Occupational Stress Questionnaire    Feeling of Stress : Not at all  Social Connections: Moderately Integrated (10/23/2023)   Social Connection and Isolation Panel [NHANES]    Frequency of Communication with Friends and Family: More than three times a week    Frequency of Social Gatherings with Friends and Family: More than three times a week    Attends Religious Services: More than 4 times per year    Active Member of [employer] or Organizations: No    Attends Engineer, structural: Never    Marital Status: Married    Tobacco Counseling Counseling given: Not Answered   Clinical Intake:  Pre-visit preparation completed: Yes  Pain : No/denies pain     BMI - recorded: 25.89 Nutritional Status: BMI 25 -29 Overweight Nutritional Risks: None Diabetes: Yes CBG done?: No Did pt. bring in CBG monitor from home?: No  How often do you need to have someone help you when you read instructions, pamphlets, or other written materials from your doctor or pharmacy?: 1 - Never  Interpreter Needed?: No  Information entered by :: Hassell Halim, CMA   Activities of Daily Living    10/23/2023   10:47 AM  In your present state of health, do you have any difficulty performing the following activities:  Hearing? 0  Vision? 0  Difficulty concentrating or making decisions? 0  Walking or climbing stairs? 0  Dressing or bathing? 0  Doing errands, shopping? 0  Preparing Food and eating ? N  Using the Toilet? N  In the past six months, have you accidently leaked urine? Y  Comment some urgency but declines Urology referral  Do you have problems with loss of bowel control? N  Managing your  Medications? N  Managing your Finances? N  Housekeeping or managing your Housekeeping? N    Patient Care Team: Myrlene Broker, MD as PCP - General (Internal Medicine)  Indicate any recent Medical Services you may have received from other than [facility] providers in the past year (date may be approximate).     Assessment:   This is a routine wellness examination for Rocco.  Hearing/Vision screen Hearing Screening - Comments:: Denies hearing difficulties   Vision Screening - Comments:: Wears rx glasses - up to date with routine eye exams with  [facility]   Goals Addressed               This Visit's Progress     Patient Stated (pt-stated)  Patient stated he plans to stay active.       Depression Screen    10/23/2023   10:51 AM 09/27/2023    7:43 AM 08/13/2022    3:38 PM 03/09/2022    3:40 PM 01/12/2022    1:21 PM 06/07/2021    1:10 PM 07/04/2020    2:00 PM  PHQ 2/9 Scores  PHQ - 2 Score 0 0 0 0 0 0 0  PHQ- 9 Score 0 0 0 0 1      Fall Risk    10/23/2023   10:53 AM 09/27/2023    7:42 AM 03/01/2023   10:22 AM 03/01/2023   10:21 AM 08/13/2022    3:38 PM  Fall Risk   Falls in the past year? 0 0 0 0 0  Number falls in past yr: 0 0 0 0 0  Injury with Fall? 0 0 0 0 0  Risk for fall due to : No Fall Risks      Follow up Falls evaluation completed;Falls prevention discussed Falls evaluation completed Falls evaluation completed  Falls evaluation completed    MEDICARE RISK AT HOME: Medicare Risk at Home Any stairs in or around the home?: Yes (outside of home) If so, are there any without handrails?: No Home free of loose throw rugs in walkways, pet beds, electrical cords, etc?: Yes Adequate lighting in your home to reduce risk of falls?: Yes Life alert?: No Use of a cane, walker or w/c?: No Grab bars in the bathroom?: No Shower chair or bench in shower?: Yes Elevated toilet seat or a handicapped toilet?: No  TIMED UP AND GO:  Was the test performed?  No     Cognitive Function:        10/23/2023   10:55 AM  6CIT Screen  What Year? 0 points  What month? 0 points  What time? 0 points  Count back from 20 0 points  Months in reverse 0 points  Repeat phrase 0 points  Total Score 0 points    Immunizations Immunization History  Administered Date(s) Administered   Fluad Quad(high Dose 65+) 05/27/2019, 07/05/2020, 06/07/2021   Fluad Trivalent(High Dose 65+) 09/27/2023   Hepatitis B 06/06/2004, 11/30/2004, 01/09/2005   Influenza,inj,Quad PF,6+ Mos 05/14/2013, 06/30/2017   Influenza-Unspecified 07/21/2015, 07/11/2016, 06/25/2019   MMR 05/29/2004   Moderna Sars-Covid-2 Vaccination 09/14/2019, 10/13/2019, 07/20/2020   PPD Test 01/24/2012, 01/29/2012   Pneumococcal Conjugate-13 08/11/2015   Pneumococcal Polysaccharide-23 06/18/2017, 05/27/2019   Tdap 08/11/2015   Varicella 05/29/2004    TDAP status: Up to date - 08/11/2015  Flu Vaccine status: Up to date - 09/27/2023  Pneumococcal vaccine status: Up to date - 05/27/2019  Covid-19 vaccine status: Declined, Education has been provided regarding the importance of this vaccine but patient still declined. Advised may receive this vaccine at local pharmacy or Health Dept.or vaccine clinic. Aware to provide a copy of the vaccination record if obtained from local pharmacy or Health Dept. Verbalized acceptance and understanding.  Qualifies for Shingles Vaccine? Yes   Zostavax completed No   Shingrix Completed?: No.    Education has been provided regarding the importance of this vaccine. Patient has been advised to call insurance company to determine out of pocket expense if they have not yet received this vaccine. Advised may also receive vaccine at local pharmacy or Health Dept. Verbalized acceptance and understanding.  Screening Tests Health Maintenance  Topic Date Due   Fecal DNA (Cologuard)  Never done   Zoster Vaccines- Shingrix (1 of  2) Never done   OPHTHALMOLOGY EXAM  03/15/2023   FOOT  EXAM  02/29/2024   HEMOGLOBIN A1C  03/26/2024   Diabetic kidney evaluation - eGFR measurement  10/17/2024   Diabetic kidney evaluation - Urine ACR  10/17/2024   Medicare Annual Wellness (AWV)  10/22/2024   DTaP/Tdap/Td (2 - Td or Tdap) 08/10/2025   Pneumonia Vaccine 47+ Years old  Completed   INFLUENZA VACCINE  Completed   Hepatitis C Screening  Completed   HPV VACCINES  Aged Out   COVID-19 Vaccine  Discontinued    Health Maintenance  Health Maintenance Due  Topic Date Due   Fecal DNA (Cologuard)  Never done   Zoster Vaccines- Shingrix (1 of 2) Never done   OPHTHALMOLOGY EXAM  03/15/2023   Colorectal Cancer Screening: Cologuard order placed on 10/23/2023  Additional Screening:  Hepatitis C Screening: does qualify; Completed 05/14/2013  Vision Screening: Recommended annual ophthalmology exams for early detection of glaucoma and other disorders of the eye. Is the patient up to date with their annual eye exam?  Yes  Who is the provider or what is the name of the office in which the patient attends annual eye exams? [facility] - due for eye exam 03/2024 If pt is not established with a provider, would they like to be referred to a provider to establish care? No .   Dental Screening: Recommended annual dental exams for proper oral hygiene  Diabetic Foot Exam: Diabetic Foot Exam: Completed 03/01/2023.  Due 02/2024  Community Resource Referral / Chronic Care Management: CRR required this visit?  No   CCM required this visit?  No     Plan:     I have personally reviewed and noted the following in the patient's chart:   Medical and social history Use of alcohol, tobacco or illicit drugs  Current medications and supplements including opioid prescriptions. Patient is not currently taking opioid prescriptions. Functional ability and status Nutritional status Physical activity Advanced directives List of other physicians Hospitalizations, surgeries, and ER visits in previous  12 months Vitals Screenings to include cognitive, depression, and falls Referrals and appointments  In addition, I have reviewed and discussed with patient certain preventive protocols, quality metrics, and best practice recommendations. A written personalized care plan for preventive services as well as general preventive health recommendations were provided to patient.     Darreld Mclean, CMA   10/23/2023   After Visit Summary: (In Person-Printed) AVS printed and given to the patient  Nurse Notes: Placed an order for Cologuard test for colon cancer screening.

## 2023-10-23 NOTE — Patient Instructions (Signed)
Mr. Richard Lucas , Thank you for taking time to come for your Medicare Wellness Visit. I appreciate your ongoing commitment to your health goals. Please review the following plan we discussed and let me know if I can assist you in the future.   Referrals/Orders/Follow-Ups/Clinician Recommendations: Cologuard test ordered.  Aim for 30 minutes of exercise or brisk walking, 6-8 glasses of water, and 5 servings of fruits and vegetables each day.   This is a list of the screening recommended for you and due dates:  Health Maintenance  Topic Date Due   Cologuard (Stool DNA test)  Never done   Zoster (Shingles) Vaccine (1 of 2) Never done   Eye exam for diabetics  03/15/2023   Complete foot exam   02/29/2024   Hemoglobin A1C  03/26/2024   Yearly kidney function blood test for diabetes  10/17/2024   Yearly kidney health urinalysis for diabetes  10/17/2024   Medicare Annual Wellness Visit  10/22/2024   DTaP/Tdap/Td vaccine (2 - Td or Tdap) 08/10/2025   Pneumonia Vaccine  Completed   Flu Shot  Completed   Hepatitis C Screening  Completed   HPV Vaccine  Aged Out   COVID-19 Vaccine  Discontinued    Advanced directives: (Declined) Advance directive discussed with you today. Even though you declined this today, please call our office should you change your mind, and we can give you the proper paperwork for you to fill out.  Next Medicare Annual Wellness Visit scheduled for next year: Yes - 10/23/2024

## 2023-10-24 ENCOUNTER — Telehealth: Payer: Self-pay

## 2023-10-24 LAB — MICROALBUMIN / CREATININE URINE RATIO
Creatinine,U: 110.8 mg/dL
Microalb Creat Ratio: 982 mg/g — ABNORMAL HIGH (ref 0.0–30.0)
Microalb, Ur: 108.8 mg/dL — ABNORMAL HIGH (ref 0.0–1.9)

## 2023-10-24 NOTE — Telephone Encounter (Signed)
CRITICAL VALUE STICKER  CRITICAL VALUE: MicroAlbumin 982  RECEIVER (on-site recipient of call):  DATE & TIME NOTIFIED: 1649  MESSENGER (representative from lab): Mariea Clonts  MD NOTIFIED: sent to DOD - John  Highly elevated from previous results.

## 2023-10-24 NOTE — Telephone Encounter (Signed)
Ok noted

## 2023-11-05 ENCOUNTER — Other Ambulatory Visit: Payer: Self-pay | Admitting: Internal Medicine

## 2023-11-05 DIAGNOSIS — I10 Essential (primary) hypertension: Secondary | ICD-10-CM

## 2023-11-15 ENCOUNTER — Other Ambulatory Visit: Payer: Self-pay | Admitting: Internal Medicine

## 2023-11-19 ENCOUNTER — Encounter: Payer: Medicare Other | Admitting: Internal Medicine

## 2023-12-13 ENCOUNTER — Ambulatory Visit: Payer: Self-pay

## 2023-12-13 ENCOUNTER — Encounter: Payer: Self-pay | Admitting: Family Medicine

## 2023-12-13 ENCOUNTER — Ambulatory Visit: Admitting: Family Medicine

## 2023-12-13 VITALS — BP 120/60 | HR 96 | Temp 98.6°F | Ht 65.5 in | Wt 151.0 lb

## 2023-12-13 DIAGNOSIS — R82998 Other abnormal findings in urine: Secondary | ICD-10-CM

## 2023-12-13 DIAGNOSIS — R319 Hematuria, unspecified: Secondary | ICD-10-CM

## 2023-12-13 DIAGNOSIS — R399 Unspecified symptoms and signs involving the genitourinary system: Secondary | ICD-10-CM | POA: Diagnosis not present

## 2023-12-13 LAB — POCT URINALYSIS DIPSTICK
Bilirubin, UA: NEGATIVE
Glucose, UA: NEGATIVE
Ketones, UA: NEGATIVE
Nitrite, UA: NEGATIVE
Protein, UA: POSITIVE — AB
Spec Grav, UA: 1.015 (ref 1.010–1.025)
Urobilinogen, UA: 1 U/dL
pH, UA: 7 (ref 5.0–8.0)

## 2023-12-13 MED ORDER — NITROFURANTOIN MONOHYD MACRO 100 MG PO CAPS: 100.0000 mg | ORAL_CAPSULE | Freq: Two times a day (BID) | ORAL | 0 refills | Status: AC

## 2023-12-13 NOTE — Telephone Encounter (Signed)
 Copied from CRM [id number]. Topic: Clinical - Red Word Triage >> Dec 13, 2023  8:42 AM Richard Lucas wrote: [facility] that prompted transfer to Nurse Triage: Blood clots in urine, slight when urinating  Chief Complaint: Urinary symptoms Symptoms: Hematuria, urinary urgency, burning while urinating Frequency: Yesterday Pertinent Negatives: Patient denies fever Disposition: [] ED /[] Urgent Care (no appt availability in office) / [x] Appointment(In office/virtual)/ []  [place] [facility]/ [] Home Care/ [] Refused Recommended Disposition /[] [place] [facility]/ []  Follow-up with PCP Additional Notes: Patient called in to report urinary symptoms that started yesterday. Patient reported hematuria, urinary urgency and burning while urinating. Patient stated he has lower back pain last week. Patient denied fever. Patient stated he is still able to pass urine normally. Advised patient to see provider in 24 hours, per protocol. No availability with PCP/PCP office. Scheduled patient at alternate office today. Provided care advice and instructed patient to call back if symptoms worsen. Patient complied.   Reason for Disposition  Urinating more frequently than usual (i.e., frequency)  Answer Assessment - Initial Assessment Questions 1. SYMPTOM: "What's the main symptom you're concerned about?" (e.g., frequency, incontinence)     Hematuria and urinary urgency 2. ONSET: "When did the urinary symptoms start?"     Yesterday 3. PAIN: "Is there any pain?" If Yes, ask: "How bad is it?" (Scale: 1-10; mild, moderate, severe)     Slight pain when urinating, denies abdominal  4. CAUSE: "What do you think is causing the symptoms?"     Unknown 5. OTHER SYMPTOMS: "Do you have any other symptoms?" (e.g., blood in urine, fever, flank pain, pain with urination)     Denies fever, slight lower back pain last week  Protocols used: Urinary Symptoms-A-AH

## 2023-12-13 NOTE — Progress Notes (Addendum)
 I,Jameka J Llittleton, CMA,acting as a Neurosurgeon for [employer], NP.,have documented all relevant documentation on the behalf of Ellender Hose, NP,as directed by  Ellender Hose, NP while in the presence of Ellender Hose, NP.  Subjective:  Patient ID: Richard Lucas , male    DOB: 07-02-1951 , 73 y.o.   MRN: [id number]  Chief Complaint  Patient presents with   Hematuria    HPI  Patient is a 73 year old male who presents today for complaints of burning sensation, frequency, urgency and blood in his urine. He reports that these symptoms  started yesterday.He states that he noted some streaks of blood in his urine just one and also when he wiped his penis , the tissue was"pinkish" so he decided to come get evaluated today instead of waiting for his upcoming annual physical on 12/17/2023 with his PCP, Dr Okey Dupre. Patient denies any trauma or injury to his penis.He admits that he used to see urologist in the past , but could not explain why exactly. Whether BPH or Erectile dysfunction.etc but states that he has not been there in at least 2 years.  Will do a urinalysis now , start patient on antibiotics and he will follow up with PCP. Patient is not on an anticoagulant.  Hematuria This is a new problem. The current episode started yesterday. The problem has been resolved since onset. The hematuria occurs during the initial portion of his urinary stream. He reports no clotting in his urine stream. He describes his urine color as light pink. Irritative symptoms include frequency. Associated symptoms include chills and dysuria. Pertinent negatives include no fever.     Past Medical History:  Diagnosis Date   Hypertension      Family History  Problem Relation Age of Onset   Hypertension Mother    Hypertension Father    Hypertension Sister    Hypertension Brother      Current Outpatient Medications:    acetaminophen (TYLENOL) 500 MG tablet, Take 2 tablets (1,000 mg total) by mouth every 6 (six) hours as  needed., Disp: 30 tablet, Rfl: 0   alfuzosin (UROXATRAL) 10 MG 24 hr tablet, Take 10 mg by mouth at bedtime., Disp: , Rfl:    Ascorbic Acid (VITAMIN C PO), Take 1 tablet by mouth daily., Disp: , Rfl:    blood glucose meter kit and supplies KIT, Dispense based on patient and insurance preference. Use daily and as needed to check sugars as directed. (FOR E11.65)., Disp: 1 each, Rfl: 0   Continuous Glucose Sensor (FREESTYLE LIBRE 3 SENSOR) MISC, USE AS DIRECTED, Disp: 2 each, Rfl: 11   Continuous Glucose Sensor (FREESTYLE LIBRE 3 SENSOR) MISC, USE AS DIRECTED, Disp: 2 each, Rfl: 11   glipiZIDE (GLUCOTROL) 5 MG tablet, TAKE 1 TABLET(5 MG) BY MOUTH DAILY BEFORE BREAKFAST, Disp: 90 tablet, Rfl: 3   ibuprofen (ADVIL) 400 MG tablet, Take 1 tablet (400 mg total) by mouth every 6 (six) hours as needed., Disp: 30 tablet, Rfl: 0   Lancets (ONETOUCH DELICA PLUS LANCET33G) MISC, TEST DAILY AS NEEDED, Disp: 100 each, Rfl: 1   lisinopril-hydrochlorothiazide (ZESTORETIC) 20-25 MG tablet, TAKE 1 TABLET BY MOUTH DAILY, Disp: 90 tablet, Rfl: 1   metFORMIN (GLUCOPHAGE) 500 MG tablet, TAKE 1 TABLET(500 MG) BY MOUTH TWICE DAILY WITH A MEAL, Disp: 180 tablet, Rfl: 3   Multiple Vitamin (MULTIVITAMIN WITH MINERALS) TABS tablet, Take 1 tablet by mouth daily., Disp: , Rfl:    nitrofurantoin, macrocrystal-monohydrate, (MACROBID) 100 MG capsule, Take 1  capsule (100 mg total) by mouth 2 (two) times daily for 5 days., Disp: 10 capsule, Rfl: 0   Omega-3 Fatty Acids (FISH OIL PO), Take 1 capsule by mouth daily., Disp: , Rfl:    ONETOUCH VERIO test strip, TEST DAILY AS NEEDED, Disp: 100 strip, Rfl: 3   rosuvastatin (CRESTOR) 10 MG tablet, Take 1 tablet (10 mg total) by mouth daily., Disp: 90 tablet, Rfl: 3   tiZANidine (ZANAFLEX) 2 MG tablet, Take 1 tablet (2 mg total) by mouth every 8 (eight) hours as needed for muscle spasms., Disp: 30 tablet, Rfl: 0   triamcinolone ointment (KENALOG) 0.1 %, APPLY TO THE AFFECTED AREA TWICE DAILY,  Disp: 80 g, Rfl: 0   No Known Allergies   Review of Systems  Constitutional:  Positive for chills and fatigue. Negative for fever.  Respiratory: Negative.    Cardiovascular: Negative.   Gastrointestinal: Negative.   Genitourinary:  Positive for dysuria, frequency and hematuria.  Musculoskeletal: Negative.   Skin: Negative.   Psychiatric/Behavioral: Negative.       Today's Vitals   12/13/23 1048  BP: 120/60  Pulse: 96  Temp: 98.6 F (37 C)  TempSrc: Oral  Weight: 151 lb (68.5 kg)  Height: 5' 5.5" (1.664 m)  PainSc: 0-No pain   Body mass index is 24.75 kg/m.  Wt Readings from Last 3 Encounters:  12/13/23 151 lb (68.5 kg)  10/23/23 158 lb (71.7 kg)  10/18/23 157 lb (71.2 kg)    The 10-year ASCVD risk score (Arnett DK, et al., 2019) is: 36%   Values used to calculate the score:     Age: 79 years     Sex: Male     Is Non-Hispanic African American: Yes     Diabetic: Yes     Tobacco smoker: No     Systolic Blood Pressure: 120 mmHg     Is BP treated: Yes     HDL Cholesterol: 34.2 mg/dL     Total Cholesterol: 165 mg/dL  Objective:  Physical Exam HENT:     Head: Normocephalic.  Cardiovascular:     Rate and Rhythm: Normal rate and regular rhythm.  Pulmonary:     Effort: Pulmonary effort is normal.     Breath sounds: Normal breath sounds.  Abdominal:     General: Bowel sounds are normal.     Tenderness: There is no right CVA tenderness or left CVA tenderness.  Neurological:     Mental Status: He is alert.         Assessment And Plan:  Hematuria, unspecified type -     POCT urinalysis dipstick  Leukocytes in urine -     Urine Culture  UTI symptoms -     Nitrofurantoin Monohyd Macro; Take 1 capsule (100 mg total) by mouth 2 (two) times daily for 5 days.  Dispense: 10 capsule; Refill: 0    Return if symptoms worsen or fail to improve, for Follow up with PCP.  Patient was given opportunity to ask questions. Patient verbalized understanding of the plan and  was able to repeat key elements of the plan. All questions were answered to their satisfaction.   I, Ellender Hose, NP, have reviewed all documentation for this visit. The documentation on 12/16/2023 for the exam, diagnosis, procedures, and orders are all accurate and complete.    IF YOU HAVE BEEN REFERRED TO A SPECIALIST, IT MAY TAKE 1-2 WEEKS TO SCHEDULE/PROCESS THE REFERRAL. IF YOU HAVE NOT HEARD FROM US/SPECIALIST IN TWO WEEKS, PLEASE GIVE  [place] A CALL AT [phone] X [phone].

## 2023-12-16 DIAGNOSIS — R319 Hematuria, unspecified: Secondary | ICD-10-CM | POA: Insufficient documentation

## 2023-12-16 DIAGNOSIS — R82998 Other abnormal findings in urine: Secondary | ICD-10-CM | POA: Insufficient documentation

## 2023-12-17 ENCOUNTER — Ambulatory Visit: Admitting: Internal Medicine

## 2023-12-17 ENCOUNTER — Encounter: Payer: Self-pay | Admitting: Internal Medicine

## 2023-12-17 VITALS — BP 126/80 | HR 91 | Temp 98.2°F | Ht 65.5 in | Wt 151.0 lb

## 2023-12-17 DIAGNOSIS — I10 Essential (primary) hypertension: Secondary | ICD-10-CM | POA: Diagnosis not present

## 2023-12-17 DIAGNOSIS — R311 Benign essential microscopic hematuria: Secondary | ICD-10-CM | POA: Diagnosis not present

## 2023-12-17 DIAGNOSIS — E785 Hyperlipidemia, unspecified: Secondary | ICD-10-CM | POA: Diagnosis not present

## 2023-12-17 DIAGNOSIS — Z0001 Encounter for general adult medical examination with abnormal findings: Secondary | ICD-10-CM | POA: Diagnosis not present

## 2023-12-17 DIAGNOSIS — E1169 Type 2 diabetes mellitus with other specified complication: Secondary | ICD-10-CM

## 2023-12-17 DIAGNOSIS — Z0000 Encounter for general adult medical examination without abnormal findings: Secondary | ICD-10-CM

## 2023-12-17 DIAGNOSIS — R066 Hiccough: Secondary | ICD-10-CM | POA: Diagnosis not present

## 2023-12-17 DIAGNOSIS — R350 Frequency of micturition: Secondary | ICD-10-CM

## 2023-12-17 DIAGNOSIS — N401 Enlarged prostate with lower urinary tract symptoms: Secondary | ICD-10-CM

## 2023-12-17 LAB — LIPID PANEL
Cholesterol: 143 mg/dL (ref 0–200)
HDL: 37.7 mg/dL — ABNORMAL LOW (ref >39.00)
LDL Cholesterol: 83 mg/dL (ref 0–99)
NonHDL: 105.62
Total CHOL/HDL Ratio: 4
Triglycerides: 115 mg/dL (ref 0.0–149.0)
VLDL: 23 mg/dL (ref 0.0–40.0)

## 2023-12-17 LAB — CBC
HCT: 29.7 % — ABNORMAL LOW (ref 39.0–52.0)
Hemoglobin: 9.8 g/dL — ABNORMAL LOW (ref 13.0–17.0)
MCHC: 33 g/dL (ref 30.0–36.0)
MCV: 78.9 fl (ref 78.0–100.0)
Platelets: 453 10*3/uL — ABNORMAL HIGH (ref 150.0–400.0)
RBC: 3.76 Mil/uL — ABNORMAL LOW (ref 4.22–5.81)
RDW: 18.5 % — ABNORMAL HIGH (ref 11.5–15.5)
WBC: 8.6 10*3/uL (ref 4.0–10.5)

## 2023-12-17 LAB — COMPREHENSIVE METABOLIC PANEL WITH GFR
ALT: 42 U/L (ref 0–53)
AST: 21 U/L (ref 0–37)
Albumin: 4.2 g/dL (ref 3.5–5.2)
Alkaline Phosphatase: 82 U/L (ref 39–117)
BUN: 23 mg/dL (ref 6–23)
CO2: 26 meq/L (ref 19–32)
Calcium: 9.8 mg/dL (ref 8.4–10.5)
Chloride: 99 meq/L (ref 96–112)
Creatinine, Ser: 1.21 mg/dL (ref 0.40–1.50)
GFR: 58.75 mL/min — ABNORMAL LOW (ref >60.00)
Glucose, Bld: 127 mg/dL — ABNORMAL HIGH (ref 70–99)
Potassium: 3.3 meq/L — ABNORMAL LOW (ref 3.5–5.1)
Sodium: 139 meq/L (ref 135–145)
Total Bilirubin: 0.9 mg/dL (ref 0.2–1.2)
Total Protein: 8.5 g/dL — ABNORMAL HIGH (ref 6.0–8.3)

## 2023-12-17 LAB — HEMOGLOBIN A1C: Hgb A1c MFr Bld: 6.5 % (ref 4.6–6.5)

## 2023-12-17 LAB — TSH: TSH: 1.73 u[IU]/mL (ref 0.35–5.50)

## 2023-12-17 LAB — URINE CULTURE

## 2023-12-17 LAB — VITAMIN B12: Vitamin B-12: 1139 pg/mL — ABNORMAL HIGH (ref 211–911)

## 2023-12-17 LAB — VITAMIN D 25 HYDROXY (VIT D DEFICIENCY, FRACTURES): VITD: 36.43 ng/mL (ref 30.00–100.00)

## 2023-12-17 MED ORDER — LISINOPRIL-HYDROCHLOROTHIAZIDE 20-25 MG PO TABS: 1.0000 | ORAL_TABLET | Freq: Every day | ORAL | 1 refills | Status: DC

## 2023-12-17 MED ORDER — ROSUVASTATIN CALCIUM 10 MG PO TABS: 10.0000 mg | ORAL_TABLET | Freq: Every day | ORAL | 3 refills | Status: AC

## 2023-12-17 MED ORDER — GLIPIZIDE 5 MG PO TABS: 5.0000 mg | ORAL_TABLET | Freq: Every day | ORAL | 3 refills | Status: AC

## 2023-12-17 MED ORDER — METFORMIN HCL 500 MG PO TABS: ORAL_TABLET | ORAL | 3 refills | Status: AC

## 2023-12-17 MED ORDER — TIZANIDINE HCL 2 MG PO TABS: 2.0000 mg | ORAL_TABLET | Freq: Three times a day (TID) | ORAL | 0 refills | Status: AC | PRN

## 2023-12-17 MED ORDER — PANTOPRAZOLE SODIUM 40 MG PO TBEC: 40.0000 mg | DELAYED_RELEASE_TABLET | Freq: Every day | ORAL | 3 refills | Status: AC

## 2023-12-17 NOTE — Patient Instructions (Addendum)
 We will check the labs today.   If we do not find a cause of the tiredness we can do a home sleep test.  We have sent in protonix to take daily to see if this helps the hiccups.  Try this for hiccups: Breath holding for 5 to 10 seconds (or as tolerated). ?Performing Valsalva maneuver, holding for five seconds. ?Sipping on or gargling with very cold water. ?Biting into a lemon. ?Pulling on the tongue. ?Swallowing a teaspoon of dry sugar. ?Pressing gently but firmly on the eyeballs. ?While sitting, pulling the knees up to the chest (or leaning forward to compress the chest); hold the position for 30 seconds to one minute if possible.

## 2023-12-17 NOTE — Progress Notes (Unsigned)
   Subjective:   Patient ID: Richard Lucas, male    DOB: 25-Jan-1951, 73 y.o.   MRN: [id number]  HPI The patient is here for physical. Is having several other issues as well ongoing hiccups, tiredness, new blood in urine (just finished antibiotics).  PM[facility], [facility], social history reviewed and updated  Review of Systems  Objective:  Physical Exam  There were no vitals filed for this visit.  Assessment & Plan:

## 2023-12-20 ENCOUNTER — Encounter: Payer: Self-pay | Admitting: Family Medicine

## 2023-12-20 DIAGNOSIS — N401 Enlarged prostate with lower urinary tract symptoms: Secondary | ICD-10-CM | POA: Insufficient documentation

## 2023-12-20 NOTE — Assessment & Plan Note (Signed)
 He is having hiccups daily most days but does not last. We discussed measures he can take to help. He is not on any medication known to trigger this. We discussed other causes. He has not had >48 hours of hiccups ever to make malignant source more likely.

## 2023-12-20 NOTE — Assessment & Plan Note (Signed)
 Referral to urology he had 1 episode of blood gross in urine but has had several other microscopic readings of this outside of UTI in past years and would warrant assessment.

## 2023-12-20 NOTE — Assessment & Plan Note (Signed)
 Checking lipid panel and adjust as needed statin.

## 2023-12-20 NOTE — Assessment & Plan Note (Signed)
 Flu shot yearly. Pneumonia complete. Shingrix counseled. Tetanus up to date. Cologuard due. Counseled about sun safety and mole surveillance. Counseled about the dangers of distracted driving. Given 10 year screening recommendations.

## 2023-12-20 NOTE — Assessment & Plan Note (Signed)
 Checking HGA1c and adjust as needed medications. He is unsure how sugars are doing.

## 2023-12-20 NOTE — Assessment & Plan Note (Signed)
 Checking CMP and BP at goal on meds adjust as needed.

## 2023-12-20 NOTE — Assessment & Plan Note (Signed)
 Needs new referral to urology as it has been >3 years since he has seen them.

## 2023-12-24 ENCOUNTER — Encounter: Payer: Self-pay | Admitting: Internal Medicine

## 2024-01-03 DIAGNOSIS — R310 Gross hematuria: Secondary | ICD-10-CM | POA: Diagnosis not present

## 2024-01-03 DIAGNOSIS — R350 Frequency of micturition: Secondary | ICD-10-CM | POA: Diagnosis not present

## 2024-01-03 DIAGNOSIS — R3121 Asymptomatic microscopic hematuria: Secondary | ICD-10-CM | POA: Diagnosis not present

## 2024-01-03 DIAGNOSIS — N401 Enlarged prostate with lower urinary tract symptoms: Secondary | ICD-10-CM | POA: Diagnosis not present

## 2024-01-28 DIAGNOSIS — R310 Gross hematuria: Secondary | ICD-10-CM | POA: Diagnosis not present

## 2024-02-26 DIAGNOSIS — N401 Enlarged prostate with lower urinary tract symptoms: Secondary | ICD-10-CM | POA: Diagnosis not present

## 2024-02-26 DIAGNOSIS — R310 Gross hematuria: Secondary | ICD-10-CM | POA: Diagnosis not present

## 2024-02-26 DIAGNOSIS — R350 Frequency of micturition: Secondary | ICD-10-CM | POA: Diagnosis not present

## 2024-03-05 ENCOUNTER — Other Ambulatory Visit: Payer: Self-pay | Admitting: Internal Medicine

## 2024-06-07 ENCOUNTER — Other Ambulatory Visit: Payer: Self-pay | Admitting: Internal Medicine

## 2024-07-01 DIAGNOSIS — R3912 Poor urinary stream: Secondary | ICD-10-CM | POA: Diagnosis not present

## 2024-07-01 DIAGNOSIS — R350 Frequency of micturition: Secondary | ICD-10-CM | POA: Diagnosis not present

## 2024-07-01 DIAGNOSIS — R351 Nocturia: Secondary | ICD-10-CM | POA: Diagnosis not present

## 2024-07-01 DIAGNOSIS — N401 Enlarged prostate with lower urinary tract symptoms: Secondary | ICD-10-CM | POA: Diagnosis not present

## 2024-08-15 ENCOUNTER — Other Ambulatory Visit: Payer: Self-pay | Admitting: Internal Medicine

## 2024-08-18 ENCOUNTER — Other Ambulatory Visit: Payer: Self-pay | Admitting: Internal Medicine

## 2024-08-18 MED ORDER — FREESTYLE LIBRE 3 SENSOR MISC: 11 refills | Status: AC

## 2024-08-18 NOTE — Telephone Encounter (Signed)
 Copied from CRM [id number]. Topic: Clinical - Medication Refill >> Aug 18, 2024  2:59 PM Alfonso ORN wrote: Medication: Continuous Glucose Sensor (FREESTYLE LIBRE 3 SENSOR) MISC Continuous Glucose Sensor (FREESTYLE LIBRE 3 SENSOR) MISC   Has the patient contacted their pharmacy? Yes  This is the patient's preferred pharmacy:  [facility] [phone] - [place], [place] - [address] AT [facility] [address] [place] [address] Phone: [phone] Fax: [phone]   Is this the correct pharmacy for this prescription? Yes   Has the prescription been filled recently? No  Is the patient out of the medication? Yes  Has the patient been seen for an appointment in the last year OR does the patient have an upcoming appointment? Yes  Can we respond through MyChart? Yes

## 2024-08-19 ENCOUNTER — Other Ambulatory Visit: Payer: Self-pay | Admitting: Internal Medicine

## 2024-08-19 ENCOUNTER — Encounter: Payer: Self-pay | Admitting: Internal Medicine

## 2024-08-20 NOTE — Telephone Encounter (Signed)
 Spoke with pharmacist and attempted to refill for pt. She stated that it is too soon to refill the [employer]. Pt is not due until 12/16.  Called and spoke with pt and made him aware of this information. Pharmacy will refill automatically on 12/16. Pt verbalized understanding.

## 2024-08-25 DIAGNOSIS — H5203 Hypermetropia, bilateral: Secondary | ICD-10-CM | POA: Diagnosis not present

## 2024-08-28 ENCOUNTER — Ambulatory Visit (INDEPENDENT_AMBULATORY_CARE_PROVIDER_SITE_OTHER): Admitting: Internal Medicine

## 2024-08-28 VITALS — BP 140/60 | HR 83 | Temp 98.4°F | Ht 65.5 in | Wt 164.6 lb

## 2024-08-28 DIAGNOSIS — N401 Enlarged prostate with lower urinary tract symptoms: Secondary | ICD-10-CM

## 2024-08-28 DIAGNOSIS — E1169 Type 2 diabetes mellitus with other specified complication: Secondary | ICD-10-CM | POA: Diagnosis not present

## 2024-08-28 DIAGNOSIS — I10 Essential (primary) hypertension: Secondary | ICD-10-CM

## 2024-08-28 DIAGNOSIS — R350 Frequency of micturition: Secondary | ICD-10-CM | POA: Diagnosis not present

## 2024-08-28 DIAGNOSIS — Z7984 Long term (current) use of oral hypoglycemic drugs: Secondary | ICD-10-CM | POA: Diagnosis not present

## 2024-08-28 LAB — POCT GLYCOSYLATED HEMOGLOBIN (HGB A1C): Hemoglobin A1C: 6.8 % — AB (ref 4.0–5.6)

## 2024-08-28 MED ORDER — BLOOD GLUCOSE MONITORING SUPPL DEVI: 1.0000 | 0 refills | Status: AC

## 2024-08-28 MED ORDER — LANCET DEVICE MISC: 1.0000 | 0 refills | Status: AC

## 2024-08-28 MED ORDER — BLOOD GLUCOSE TEST VI STRP: 1.0000 | ORAL_STRIP | 0 refills | Status: DC

## 2024-08-28 MED ORDER — LANCETS MISC: 1.0000 | 0 refills | Status: DC

## 2024-08-28 NOTE — Patient Instructions (Addendum)
 We checked the sugar levels today and they are 6.8 today.   We will see you back in 3-6 months and have sent in a new glucose meter.   Consider doing the colon cancer test that came in the mail to check you for colon cancer.

## 2024-08-28 NOTE — Progress Notes (Signed)
 "  Subjective:   Patient ID: Richard Lucas, male    DOB: [birth year], 73 y.o.   MRN: [id number]  Discussed the use of AI scribe software for clinical note transcription with the patient, who gave verbal consent to proceed.  History of Present Illness Richard Lucas is a 73 year old male with diabetes who presents for follow-up on blood sugar levels and other health concerns.  His A1c level is currently 6.8, which has increased slightly from previous readings of 6.5 and 6.7. Blood sugar levels sometimes rise, particularly when taking certain medications, and he occasionally takes additional medication to manage this. He notes a weight gain of about ten pounds since April, which he believes may be affecting his blood sugar levels. He attributes this weight gain to possible changes in diet and reduced physical activity, mentioning that he sometimes skips meals and eats vegan.   He is scheduled for cataract surgery but has not yet received a date for the procedure. It has been three days since the referral was made.  He reports ongoing issues with dry skin, which he occasionally treats with a prescribed cream. He admits to not moisturizing enough, especially during the winter months when the air is drier. He tries to moisturize after bathing but does not do so consistently.  He mentions that he is still undergoing monitoring for prostate cancer and was given a sample packet of Gemtesa, which he found effective. However, the medication is costly and he has not received further feedback after returning a phone call from the pharmacy.  Review of Systems  Constitutional: Negative.   HENT: Negative.    Eyes: Negative.   Respiratory:  Negative for cough, chest tightness and shortness of breath.   Cardiovascular:  Negative for chest pain, palpitations and leg swelling.  Gastrointestinal:  Negative for abdominal distention, abdominal pain, constipation, diarrhea, nausea and vomiting.  Musculoskeletal:  Negative.   Skin: Negative.   Neurological: Negative.   Psychiatric/Behavioral: Negative.      Objective:  Physical Exam Constitutional:      Appearance: He is well-developed.  HENT:     Head: Normocephalic and atraumatic.  Cardiovascular:     Rate and Rhythm: Normal rate and regular rhythm.  Pulmonary:     Effort: Pulmonary effort is normal. No respiratory distress.     Breath sounds: Normal breath sounds. No wheezing or rales.  Abdominal:     General: Bowel sounds are normal. There is no distension.     Palpations: Abdomen is soft.     Tenderness: There is no abdominal tenderness.  Musculoskeletal:     Cervical back: Normal range of motion.  Skin:    General: Skin is warm and dry.     Comments: Foot exam done  Neurological:     Mental Status: He is alert and oriented to person, place, and time.     Coordination: Coordination normal.     Vitals:   08/28/24 0828 08/28/24 0850 08/28/24 0851  BP: (!) 140/60 136/62 (!) 140/60  Pulse: 83    Temp: 98.4 F (36.9 C)    TempSrc: Oral    SpO2: 97%    Weight: 164 lb 9.6 oz (74.7 kg)    Height: 5' 5.5 (1.664 m)     Assessment and Plan Assessment & Plan Type 2 diabetes mellitus   A1c is 6.8, indicating diabetes. Weight gain may contribute to hyperglycemia. Sent prescription for a new blood sugar monitor to the pharmacy. Continue metformin  and glipizide . Is  on ACE-I and statin. Foot exam done and eye exam up to date.  Essential hypertension Patient was running late and caused initial BP to be elevated. Repeat was normal. Home readings are normal. Continue current regimen.   Benign prostatic hyperplasia with lower urinary tract symptoms   Symptoms are managed with Gemtesa, though cost is a concern. Continue Gemtesa for symptom management.  General Health Maintenance   He declined colon cancer screening got cologuard in the mail but did not plan to do but continues prostate cancer screening.   "

## 2024-09-08 ENCOUNTER — Other Ambulatory Visit: Payer: Self-pay | Admitting: Internal Medicine

## 2024-09-08 DIAGNOSIS — I10 Essential (primary) hypertension: Secondary | ICD-10-CM

## 2024-09-15 ENCOUNTER — Other Ambulatory Visit: Payer: Self-pay | Admitting: Internal Medicine

## 2024-09-26 ENCOUNTER — Other Ambulatory Visit: Payer: Self-pay | Admitting: Internal Medicine

## 2024-09-29 ENCOUNTER — Encounter: Payer: Self-pay | Admitting: *Deleted

## 2024-09-29 NOTE — Progress Notes (Signed)
 Richard Lucas                                          MRN: [id number]   09/29/2024   The [facility] Quality Team Specialist reviewed this patient medical record for the purposes of chart review for care gap closure. The following were reviewed: abstraction for care gap closure-kidney health evaluation for diabetes:eGFR  and uACR.    [facility] Quality Team

## 2024-10-23 ENCOUNTER — Ambulatory Visit: Payer: Medicare Other

## 2024-12-22 ENCOUNTER — Encounter: Admitting: Internal Medicine
